# Patient Record
Sex: Male | Born: 1940
Health system: Southern US, Community
[De-identification: ages and names within clinical notes are randomized; demographics above are authoritative.]

## PROBLEM LIST (undated history)

## (undated) DIAGNOSIS — N400 Enlarged prostate without lower urinary tract symptoms: Secondary | ICD-10-CM

## (undated) DIAGNOSIS — I10 Essential (primary) hypertension: Secondary | ICD-10-CM

## (undated) HISTORY — PX: JOINT REPLACEMENT: SHX530

## (undated) HISTORY — DX: Essential (primary) hypertension: I10

## (undated) HISTORY — PX: TONSILLECTOMY: SUR1361

## (undated) HISTORY — PX: HERNIA REPAIR: SHX51

## (undated) HISTORY — DX: Benign prostatic hyperplasia without lower urinary tract symptoms: N40.0

## (undated) HISTORY — PX: PROSTATE SURGERY: SHX751

---

## 2011-01-04 LAB — HM COLONOSCOPY

## 2012-10-26 ENCOUNTER — Encounter (HOSPITAL_BASED_OUTPATIENT_CLINIC_OR_DEPARTMENT_OTHER): Payer: Self-pay | Admitting: *Deleted

## 2012-10-26 ENCOUNTER — Emergency Department (HOSPITAL_BASED_OUTPATIENT_CLINIC_OR_DEPARTMENT_OTHER)
Admission: EM | Admit: 2012-10-26 | Discharge: 2012-10-26 | Disposition: A | Discharge status: Home / Self Care | Payer: Medicare Other | Attending: Emergency Medicine | Admitting: Emergency Medicine

## 2012-10-26 ENCOUNTER — Emergency Department (HOSPITAL_BASED_OUTPATIENT_CLINIC_OR_DEPARTMENT_OTHER): Payer: Medicare Other

## 2012-10-26 DIAGNOSIS — S61219A Laceration without foreign body of unspecified finger without damage to nail, initial encounter: Secondary | ICD-10-CM

## 2012-10-26 DIAGNOSIS — S61209A Unspecified open wound of unspecified finger without damage to nail, initial encounter: Secondary | ICD-10-CM | POA: Insufficient documentation

## 2012-10-26 DIAGNOSIS — Z23 Encounter for immunization: Secondary | ICD-10-CM | POA: Insufficient documentation

## 2012-10-26 DIAGNOSIS — Y9389 Activity, other specified: Secondary | ICD-10-CM | POA: Insufficient documentation

## 2012-10-26 DIAGNOSIS — W278XXA Contact with other nonpowered hand tool, initial encounter: Secondary | ICD-10-CM | POA: Insufficient documentation

## 2012-10-26 DIAGNOSIS — Y929 Unspecified place or not applicable: Secondary | ICD-10-CM | POA: Insufficient documentation

## 2012-10-26 MED ORDER — TETANUS-DIPHTH-ACELL PERTUSSIS 5-2.5-18.5 LF-MCG/0.5 IM SUSP
0.5000 mL | Freq: Once | INTRAMUSCULAR | Status: AC
  Administered 2012-10-26: 0.5 mL via INTRAMUSCULAR
  Filled 2012-10-26: qty 0.5

## 2012-10-26 NOTE — ED Notes (Signed)
MD at bedside. 

## 2012-10-26 NOTE — ED Notes (Signed)
Pt c/o left index finger laceration by hand saw x 30 mins ago .

## 2012-10-26 NOTE — ED Notes (Signed)
Suture cart at bedside 

## 2012-10-26 NOTE — ED Provider Notes (Addendum)
History     CSN: [id number]  Arrival date & time 10/26/12  1529   First MD Initiated Contact with Patient 10/26/12 1647      Chief Complaint  Patient presents with  . Laceration    (Consider location/radiation/quality/duration/timing/severity/associated sxs/prior treatment) Patient is a 72 y.o. male presenting with skin laceration. The history is provided by the patient.  Laceration Location:  Finger Finger laceration location:  R index finger Length (cm):  2 Depth:  Cutaneous Quality: jagged   Bleeding: controlled   Time since incident:  30 minutes Injury mechanism: table saw. Pain details:    Quality:  Aching   Severity:  Mild   Timing:  Constant   Progression:  Unchanged Foreign body present:  No foreign bodies Tetanus status:  Out of date   History reviewed. No pertinent past medical history.  Past Surgical History  Procedure Laterality Date  . Hernia repair    . Joint replacement    . Tonsillectomy      History reviewed. No pertinent family history.  History  Substance Use Topics  . Smoking status: Never Smoker   . Smokeless tobacco: Not on file  . Alcohol Use: No      Review of Systems  Neurological: Negative for weakness and numbness.  All other systems reviewed and are negative.    Allergies  Review of patient's allergies indicates no known allergies.  Home Medications  No current outpatient prescriptions on file.  BP 161/81  Pulse 70  Temp(Src) 98.6 F (37 C) (Oral)  Resp 16  Ht 5\' 10"  (1.778 m)  Wt 175 lb (79.379 kg)  BMI 25.11 kg/m2  SpO2 100%  Physical Exam  Nursing note and vitals reviewed. Constitutional: He is oriented to person, place, and time. He appears well-developed and well-nourished. No distress.  HENT:  Head: Normocephalic and atraumatic.  Eyes: EOM are normal. Pupils are equal, round, and reactive to light.  Cardiovascular: Normal rate.   Pulmonary/Chest: Effort normal.  Musculoskeletal:       Right hand: He  exhibits tenderness and laceration. He exhibits normal range of motion, no bony tenderness, no deformity and no swelling. Normal sensation noted. Normal strength noted.       Hands: Laceration to the tip of the index finger just distal to the nail.  No nail invovlement.  Arthritis changeds to DIP joint causing decreased ROM.  Normal sensation.  Neurological: He is alert and oriented to person, place, and time.  Skin: Skin is warm and dry. No rash noted. No erythema.    ED Course  Procedures (including critical care time)  Labs Reviewed - No data to display Dg Finger Index Left  10/26/2012   *RADIOLOGY REPORT*  Clinical Data: Laceration from table saw  LEFT INDEX FINGER 2+V  Comparison: None.  Findings: Three views of the left second finger submitted.  No acute fracture or subluxation.  Extensive degenerative osteoarthritic changes distal interphalangeal joint.  Mild narrowing of joint space proximal interphalangeal joint.  IMPRESSION: No acute fracture or subluxation.  Degenerative changes distal interphalangeal joint.   Original Report Authenticated By: Natasha Mead, M.D.   LACERATION REPAIR Performed by: Gwyneth Sprout Authorized by: Gwyneth Sprout Consent: Verbal consent obtained. Risks and benefits: risks, benefits and alternatives were discussed Consent given by: patient Patient identity confirmed: provided demographic data Prepped and Draped in normal sterile fashion Wound explored  Laceration Location: right index finger  Laceration Length: 2cm  No Foreign Bodies seen or palpated  Anesthesia: local infiltration  Local anesthetic:  lidocaine 1% without epinephrine  Anesthetic total: 1 ml  Irrigation method: syringe Amount of cleaning: standard  Skin closure: 4.0 prolene  Number of sutures: 6  Technique: simple interrupted.  Patient tolerance: Patient tolerated the procedure well with no immediate complications.   1. Finger laceration, initial encounter        MDM   Right index finger laceration from a table saw without underlying fracture and no significant nailbed laceration. Laceration that the tip of the finger and was repaired as above. Tetanus shot updated today. Normal sensation with decreased range of motion however that is chronic from arthritic changes.  No tendon damage.        Gwyneth Sprout, MD 10/26/12 1729  Gwyneth Sprout, MD 10/26/12 1731

## 2012-12-07 ENCOUNTER — Emergency Department (HOSPITAL_BASED_OUTPATIENT_CLINIC_OR_DEPARTMENT_OTHER)
Admission: EM | Admit: 2012-12-07 | Discharge: 2012-12-07 | Disposition: A | Discharge status: Home / Self Care | Payer: Medicare Other | Attending: Emergency Medicine | Admitting: Emergency Medicine

## 2012-12-07 ENCOUNTER — Encounter (HOSPITAL_BASED_OUTPATIENT_CLINIC_OR_DEPARTMENT_OTHER): Payer: Self-pay

## 2012-12-07 DIAGNOSIS — L03211 Cellulitis of face: Secondary | ICD-10-CM | POA: Insufficient documentation

## 2012-12-07 DIAGNOSIS — L0291 Cutaneous abscess, unspecified: Secondary | ICD-10-CM

## 2012-12-07 DIAGNOSIS — Z79899 Other long term (current) drug therapy: Secondary | ICD-10-CM | POA: Insufficient documentation

## 2012-12-07 DIAGNOSIS — L0201 Cutaneous abscess of face: Secondary | ICD-10-CM | POA: Insufficient documentation

## 2012-12-07 DIAGNOSIS — R509 Fever, unspecified: Secondary | ICD-10-CM | POA: Insufficient documentation

## 2012-12-07 MED ORDER — MUPIROCIN CALCIUM 2 % EX CREA: TOPICAL_CREAM | Freq: Three times a day (TID) | CUTANEOUS | Status: DC

## 2012-12-07 MED ORDER — SULFAMETHOXAZOLE-TRIMETHOPRIM 800-160 MG PO TABS: 1.0000 | ORAL_TABLET | Freq: Two times a day (BID) | ORAL | Status: DC

## 2012-12-07 NOTE — ED Notes (Signed)
Facial pain an swelling x 2 days.

## 2012-12-07 NOTE — ED Provider Notes (Signed)
History    CSN: [id number] Arrival date & time 12/07/12  1032  First MD Initiated Contact with Patient 12/07/12 1050     Chief Complaint  Patient presents with  . Facial Swelling   (Consider location/radiation/quality/duration/timing/severity/associated sxs/prior Treatment) HPI Comments: Patient presents with facial swelling. He states that it started 2 days ago with a small pimple in his left nare. This area has become more tender. He now has surrounding swelling to the left side of the space. He has no difficulty breathing or tongue or lip swelling. He has had some subjective fevers but no chills. He's had no nausea or vomiting. He has constant throbbing pain to his left nare.  History reviewed. No pertinent past medical history. Past Surgical History  Procedure Laterality Date  . Hernia repair    . Joint replacement    . Tonsillectomy     No family history on file. History  Substance Use Topics  . Smoking status: Never Smoker   . Smokeless tobacco: Not on file  . Alcohol Use: No    Review of Systems  Constitutional: Positive for fever. Negative for chills, diaphoresis and fatigue.  HENT: Negative for congestion, rhinorrhea and sneezing.   Eyes: Negative.   Respiratory: Negative for cough, chest tightness and shortness of breath.   Cardiovascular: Negative for chest pain and leg swelling.  Gastrointestinal: Negative for nausea, vomiting, abdominal pain, diarrhea and blood in stool.  Genitourinary: Negative for frequency, hematuria, flank pain and difficulty urinating.  Musculoskeletal: Negative for back pain and arthralgias.  Skin: Positive for wound. Negative for rash.  Neurological: Negative for dizziness, speech difficulty, weakness, numbness and headaches.    Allergies  Review of patient's allergies indicates no known allergies.  Home Medications   Current Outpatient Rx  Name  Route  Sig  Dispense  Refill  . Cholecalciferol (VITAMIN D PO)   Oral   Take by  mouth.         . naproxen sodium (ANAPROX) 220 MG tablet   Oral   Take 220 mg by mouth 2 (two) times daily with a meal.         . mupirocin cream (BACTROBAN) 2 %   Topical   Apply topically 3 (three) times daily.   15 g   0   . sulfamethoxazole-trimethoprim (BACTRIM DS,SEPTRA DS) 800-160 MG per tablet   Oral   Take 1 tablet by mouth 2 (two) times daily. One po bid x 10 days   20 tablet   0    BP 142/83  Pulse 71  Temp(Src) 98.7 F (37.1 C) (Oral)  Resp 16  Ht 5\' 10"  (1.778 m)  Wt 180 lb (81.647 kg)  BMI 25.83 kg/m2  SpO2 99% Physical Exam  Constitutional: He is oriented to person, place, and time. He appears well-developed and well-nourished.  HENT:  Head: Normocephalic and atraumatic.  Patient has a small 1 cm indurated area that is erythematous to the left knee are just at the opening of the left knee are. There is a small amount of surrounding facial erythema and swelling to the left side of the cheek. There is no drainage from the site. There is a small scab at the anterior portion of the wound. There is no drainage. There is no fluctuance.  Eyes: Pupils are equal, round, and reactive to light.  Neck: Normal range of motion. Neck supple.  Cardiovascular: Normal rate, regular rhythm and normal heart sounds.   Pulmonary/Chest: Effort normal and breath sounds normal.  No respiratory distress. He has no wheezes. He has no rales. He exhibits no tenderness.  Abdominal: Soft. Bowel sounds are normal. There is no tenderness. There is no rebound and no guarding.  Musculoskeletal: Normal range of motion. He exhibits no edema.  Lymphadenopathy:    He has no cervical adenopathy.  Neurological: He is alert and oriented to person, place, and time.  Skin: Skin is warm and dry. No rash noted.  Psychiatric: He has a normal mood and affect.    ED Course  Procedures (including critical care time) Labs Reviewed - No data to display No results found. 1. Abscess and cellulitis      MDM  Patient with a small abscess to his left knee nurse with surrounding mild cellulitis. He was started on antibiotics including Bactrim and Bactroban cream. At this point I don't feel anything that is amenable to I&D. I advised the patient to start the antibiotics and to return in 2 days for wound check or sooner if his symptoms worsen or the facial swelling increases. His tetanus shot is up-to-date.  Rolan Bucco, MD 12/07/12 1133

## 2012-12-11 ENCOUNTER — Emergency Department (HOSPITAL_BASED_OUTPATIENT_CLINIC_OR_DEPARTMENT_OTHER)
Admission: EM | Admit: 2012-12-11 | Discharge: 2012-12-11 | Disposition: A | Discharge status: Home / Self Care | Payer: Medicare Other | Attending: Emergency Medicine | Admitting: Emergency Medicine

## 2012-12-11 ENCOUNTER — Encounter (HOSPITAL_BASED_OUTPATIENT_CLINIC_OR_DEPARTMENT_OTHER): Payer: Self-pay

## 2012-12-11 DIAGNOSIS — Z791 Long term (current) use of non-steroidal anti-inflammatories (NSAID): Secondary | ICD-10-CM | POA: Insufficient documentation

## 2012-12-11 DIAGNOSIS — L0291 Cutaneous abscess, unspecified: Secondary | ICD-10-CM

## 2012-12-11 DIAGNOSIS — R509 Fever, unspecified: Secondary | ICD-10-CM | POA: Insufficient documentation

## 2012-12-11 DIAGNOSIS — K130 Diseases of lips: Secondary | ICD-10-CM | POA: Insufficient documentation

## 2012-12-11 MED ORDER — BUPIVACAINE-EPINEPHRINE (PF) 0.5% -1:200000 IJ SOLN
INTRAMUSCULAR | Status: AC
  Filled 2012-12-11: qty 1.8

## 2012-12-11 MED ORDER — LIDOCAINE HCL (PF) 1 % IJ SOLN
2.0000 mL | Freq: Once | INTRAMUSCULAR | Status: DC
  Filled 2012-12-11: qty 5

## 2012-12-11 MED ORDER — BUPIVACAINE HCL 0.25 % IJ SOLN
5.0000 mL | Freq: Once | INTRAMUSCULAR | Status: DC
  Filled 2012-12-11: qty 1

## 2012-12-11 MED ORDER — BUPIVACAINE-EPINEPHRINE PF 0.5-1:200000 % IJ SOLN
1.8000 mL | Freq: Once | INTRAMUSCULAR | Status: AC
  Filled 2012-12-11: qty 1.8

## 2012-12-11 MED ORDER — OXYCODONE-ACETAMINOPHEN 5-325 MG PO TABS: 1.0000 | ORAL_TABLET | ORAL | Status: DC | PRN

## 2012-12-11 NOTE — ED Notes (Signed)
Pt is here for a recheck of abscess on left side of face.

## 2012-12-11 NOTE — ED Provider Notes (Addendum)
CSN: [id number]     Arrival date & time 12/11/12  1746 History  This chart was scribed for Rolan Bucco, MD by Bennett Scrape, ED Scribe. This patient was seen in room MH05/MH05 and the patient's care was started at 6:00 PM.   First MD Initiated Contact with Patient 12/11/12 1758     Chief Complaint  Patient presents with  . Wound Check    Patient is a 72 y.o. male presenting with wound check. The history is provided by the patient. No language interpreter was used.  Wound Check This is a new problem. The current episode started more than 2 days ago. The problem occurs constantly. The problem has been gradually worsening. Pertinent negatives include no chest pain, no abdominal pain, no headaches and no shortness of breath. Treatments tried: antibiotics. The treatment provided mild relief.    HPI Comments: Roger Williams is a 72 y.o. male who presents to the Emergency Department complaining of increased pain in the upper lip since yesterday. He was seen on 12/07/12 for 2 days of worsening facial swelling and pain that started out as a pimple in the left nare. He was diagnosed with an abscess with surrounding mild cellulitis and was started on Bactrim and Bactroban cream. He states that the swelling has since improved but the pain became more intense yesterday. He reports an associated "low grade" fever and a mild amount of drainage from the area since yesterday. He denies chills, nausea and vomiting. Pt does not have a h/o chronic medical conditions.  He reports that he will have a PCP on October 1st, 2014 at [facility]  History reviewed. No pertinent past medical history.  Past Surgical History  Procedure Laterality Date  . Hernia repair    . Joint replacement    . Tonsillectomy     No family history on file. History  Substance Use Topics  . Smoking status: Never Smoker   . Smokeless tobacco: Not on file  . Alcohol Use: No    Review of Systems  Constitutional: Positive for  fever. Negative for chills, diaphoresis and fatigue.  HENT: Positive for facial swelling. Negative for congestion, rhinorrhea and sneezing.   Eyes: Negative.   Respiratory: Negative for cough, chest tightness and shortness of breath.   Cardiovascular: Negative for chest pain and leg swelling.  Gastrointestinal: Negative for nausea, vomiting, abdominal pain, diarrhea and blood in stool.  Genitourinary: Negative for frequency, hematuria, flank pain and difficulty urinating.  Musculoskeletal: Negative for back pain and arthralgias.  Skin: Positive for wound. Negative for rash.  Neurological: Negative for dizziness, speech difficulty, weakness, numbness and headaches.    Allergies  Review of patient's allergies indicates no known allergies.  Home Medications   Current Outpatient Rx  Name  Route  Sig  Dispense  Refill  . Cholecalciferol (VITAMIN D PO)   Oral   Take by mouth.         . mupirocin cream (BACTROBAN) 2 %   Topical   Apply topically 3 (three) times daily.   15 g   0   . naproxen sodium (ANAPROX) 220 MG tablet   Oral   Take 220 mg by mouth 2 (two) times daily with a meal.         . oxyCODONE-acetaminophen (PERCOCET) 5-325 MG per tablet   Oral   Take 1 tablet by mouth every 4 (four) hours as needed for pain.   20 tablet   0   . sulfamethoxazole-trimethoprim (BACTRIM DS,SEPTRA DS) 800-160 MG  per tablet   Oral   Take 1 tablet by mouth 2 (two) times daily. One po bid x 10 days   20 tablet   0    Triage Vitals: BP 150/84  Pulse 70  Temp(Src) 98.9 F (37.2 C) (Oral)  Resp 16  Wt 180 lb (81.647 kg)  BMI 25.83 kg/m2  SpO2 97%  Physical Exam  Nursing note and vitals reviewed. Constitutional: He is oriented to person, place, and time. He appears well-developed and well-nourished. No distress.  HENT:  Head: Normocephalic and atraumatic.  Indurated area to the left upper lip that is pointing along the inner aspect of the mucosa with minor purulent drainage, no  other facial swelling or erythema   Eyes: Pupils are equal, round, and reactive to light.  Neck: Neck supple. No tracheal deviation present.  Cardiovascular: Normal rate.   Pulmonary/Chest: Effort normal. No respiratory distress.  Musculoskeletal: Normal range of motion.  Neurological: He is alert and oriented to person, place, and time.  Skin: Skin is warm and dry.  Psychiatric: He has a normal mood and affect. His behavior is normal.    ED Course   INCISION AND DRAINAGE Date/Time: 12/11/2012 6:46 PM Performed by: Rylin Saez Authorized by: Rolan Bucco Consent: Verbal consent obtained. Risks and benefits: risks, benefits and alternatives were discussed Consent given by: patient Patient identity confirmed: verbally with patient Type: abscess Body area: head/neck (left upper lip) Anesthesia: nerve block Local anesthetic: bupivacaine 0.5% without epinephrine Anesthetic total: 1 ml Patient sedated: no Scalpel size: 11 Incision type: elliptical Drainage: purulent Drainage amount: moderate Wound treatment: wound left open Patient tolerance: Patient tolerated the procedure well with no immediate complications.   (including critical care time)  DIAGNOSTIC STUDIES: Oxygen Saturation is 97% on room air, normal by my interpretation.    COORDINATION OF CARE: 6:06 PM-Discussed treatment plan with pt at bedside and pt agreed to plan.   Labs Reviewed - No data to display No results found. 1. Abscess     MDM  Patient presented previously with a injury the early abscess to the left malar with associated cellulitis. The cellulitis has markedly improved however now he has a definite abscess which is formed in his upper lip. It was point seen in the mucosal surface. Given this I did a dental block and opened it through the mucosal surface. There is a large amount of purulent drainage. He will continue the antibiotics he is currently taking. I also given prescription for Percocet.  Advised him to return in 2 days for wound check or sooner for symptoms worsen.  I personally performed the services described in this documentation, which was scribed in my presence.  The recorded information has been reviewed and considered.     Rolan Bucco, MD 12/11/12 4098  Rolan Bucco, MD 12/11/12 1191

## 2012-12-20 ENCOUNTER — Encounter: Payer: Self-pay | Admitting: *Deleted

## 2012-12-20 ENCOUNTER — Ambulatory Visit (HOSPITAL_BASED_OUTPATIENT_CLINIC_OR_DEPARTMENT_OTHER)
Admission: RE | Admit: 2012-12-20 | Discharge: 2012-12-20 | Disposition: A | Discharge status: Home / Self Care | Payer: Medicare Other | Source: Ambulatory Visit | Attending: Physician Assistant | Admitting: Physician Assistant

## 2012-12-20 ENCOUNTER — Ambulatory Visit (INDEPENDENT_AMBULATORY_CARE_PROVIDER_SITE_OTHER): Payer: Medicare Other | Admitting: Physician Assistant

## 2012-12-20 ENCOUNTER — Encounter: Payer: Self-pay | Admitting: Physician Assistant

## 2012-12-20 VITALS — BP 128/74 | HR 63 | Temp 97.8°F | Resp 16 | Ht 69.5 in | Wt 176.8 lb

## 2012-12-20 DIAGNOSIS — Z0000 Encounter for general adult medical examination without abnormal findings: Secondary | ICD-10-CM

## 2012-12-20 DIAGNOSIS — Z01818 Encounter for other preprocedural examination: Secondary | ICD-10-CM | POA: Insufficient documentation

## 2012-12-20 LAB — URINALYSIS, ROUTINE W REFLEX MICROSCOPIC
Glucose, UA: NEGATIVE mg/dL
Leukocytes, UA: NEGATIVE
Nitrite: NEGATIVE
Specific Gravity, Urine: 1.019 (ref 1.005–1.030)
pH: 6 (ref 5.0–8.0)

## 2012-12-20 LAB — HEPATIC FUNCTION PANEL
Albumin: 4 g/dL (ref 3.5–5.2)
Bilirubin, Direct: 0.1 mg/dL (ref 0.0–0.3)
Total Bilirubin: 0.5 mg/dL (ref 0.3–1.2)

## 2012-12-20 LAB — CBC WITH DIFFERENTIAL/PLATELET
Eosinophils Relative: 2 % (ref 0–5)
Lymphocytes Relative: 26 % (ref 12–46)
Lymphs Abs: 1.1 10*3/uL (ref 0.7–4.0)
MCV: 91.8 fL (ref 78.0–100.0)
Neutro Abs: 2.7 10*3/uL (ref 1.7–7.7)
Platelets: 232 10*3/uL (ref 150–400)
RBC: 4.85 MIL/uL (ref 4.22–5.81)
WBC: 4.2 10*3/uL (ref 4.0–10.5)

## 2012-12-20 LAB — BASIC METABOLIC PANEL WITH GFR
BUN: 15 mg/dL (ref 6–23)
CO2: 33 meq/L — ABNORMAL HIGH (ref 19–32)
Calcium: 9.4 mg/dL (ref 8.4–10.5)
Chloride: 102 meq/L (ref 96–112)
Creat: 1 mg/dL (ref 0.50–1.35)
Glucose, Bld: 90 mg/dL (ref 70–99)
Potassium: 5.6 meq/L — ABNORMAL HIGH (ref 3.5–5.3)
Sodium: 139 meq/L (ref 135–145)

## 2012-12-20 LAB — LIPID PANEL
HDL: 36 mg/dL — ABNORMAL LOW (ref >39)
Triglycerides: 103 mg/dL (ref <150)

## 2012-12-20 LAB — TSH: TSH: 1.717 u[IU]/mL (ref 0.350–4.500)

## 2012-12-20 LAB — PROTIME-INR: INR: 0.87 (ref <1.50)

## 2012-12-20 LAB — PSA: PSA: 2.93 ng/mL

## 2012-12-20 NOTE — Patient Instructions (Addendum)
I will call you with the results of your labs whether normal or abnormal.  I will also call you to notify you when I have sent the letter to your physician, granting surgical clearance.  Please return to clinic in 1 year for annual exam or sooner if needed.

## 2012-12-20 NOTE — Assessment & Plan Note (Signed)
Obtain labs to include BMP, Hepatic Function tests, CBC, TSH, Lipid Panel, PSA.   Will call patient with results.

## 2012-12-20 NOTE — Assessment & Plan Note (Signed)
Obtain labs to include BMP, Hepatic Function tests, PT/INR, CBC, UA, Urine Culture.  CXR to be obtained.  EKG shows not acute abnormalities or irregular rhythm.  Clearance will be given pending normal lab results.  I will fax clearance to his Orthopedist.

## 2012-12-20 NOTE — Progress Notes (Signed)
[name], male   DOB: [birth year], 72 y.o.   MRN: [id number]   Roger Williams is a 72 year-old gentleman who presents to clinic today to establish care as to obtain clearance for hip replacement surgery scheduled for mid September of this year.  Patient denies any current concerns.    History reviewed. No pertinent past medical history.  Current Outpatient Prescriptions on File Prior to Visit  Medication Sig Dispense Refill  . Cholecalciferol (VITAMIN D PO) Take by mouth.       No current facility-administered medications on file prior to visit.   No Known Allergies  Family History  Problem Relation Age of Onset  . Parkinson's disease Father   . Parkinson's disease Brother    History   Social History  . Marital Status: Married    Spouse Name: N/A    Number of Children: N/A  . Years of Education: N/A   Social History Main Topics  . Smoking status: Never Smoker   . Smokeless tobacco: Never Used  . Alcohol Use: No  . Drug Use: No  . Sexually Active: No   Other Topics Concern  . None   Social History Narrative  . None   Review of Systems  Constitutional: Negative for fever, chills, weight loss and malaise/fatigue.  HENT: Negative for hearing loss, ear pain and tinnitus.   Eyes: Negative for blurred vision, double vision and photophobia.  Respiratory: Negative for cough, hemoptysis, sputum production, shortness of breath and wheezing.   Cardiovascular: Negative for chest pain, palpitations and leg swelling.  Gastrointestinal: Negative for heartburn, nausea, vomiting, abdominal pain, diarrhea, constipation, blood in stool and melena.  Genitourinary: Negative for dysuria, urgency, frequency, hematuria and flank pain.  Musculoskeletal: Negative for myalgias and back pain.  Skin: Negative for rash.  Neurological: Negative for dizziness, tingling, loss of consciousness and headaches.  Endo/Heme/Allergies: Does not bruise/bleed easily.  Psychiatric/Behavioral:  Negative for depression, suicidal ideas and substance abuse.   Physical Exam  Nursing note and vitals reviewed. Constitutional: He is oriented to person, place, and time and well-developed, well-nourished, and in no distress. No distress.  HENT:  Head: Normocephalic and atraumatic.  Right Ear: External ear normal.  Left Ear: External ear normal.  Nose: Nose normal.  Mouth/Throat: Oropharynx is clear and moist. No oropharyngeal exudate.  Eyes: Conjunctivae and EOM are normal. Pupils are equal, round, and reactive to light.  Neck: Normal range of motion. Neck supple. No tracheal deviation present. No thyromegaly present.  Cardiovascular: Normal rate, regular rhythm, normal heart sounds and intact distal pulses.   Pulmonary/Chest: Effort normal and breath sounds normal. No respiratory distress. He has no wheezes. He has no rales. He exhibits no tenderness.  Abdominal: Soft. Bowel sounds are normal. He exhibits no distension and no mass. There is no tenderness. There is no rebound and no guarding.  Musculoskeletal: Normal range of motion.  Lymphadenopathy:    He has no cervical adenopathy.  Neurological: He is alert and oriented to person, place, and time. He has normal reflexes. No cranial nerve deficit.  Skin: Skin is warm and dry. No rash noted. He is not diaphoretic.   Labs/Diagnostics:    EKG -- sinus bradycardia at 56bpm; no abnormalities noted.  Assessment/Plan: (1) Surgical Clearance Request -- Obtain labs to include BMP, Hepatic Function tests, PT/INR, CBC, UA, Urine Culture.  CXR to be obtained.  EKG shows not acute abnormalities or irregular rhythm.  Clearance will be given pending normal lab results.  I will  fax clearance to his Orthopedist. (2) Preventive Health -- Obtain fasting labs (see above) + TSH, Lipid Panel, PSA.   Will call patient with results.

## 2012-12-20 NOTE — Assessment & Plan Note (Signed)
Obtain labs to include BMP, Hepatic Function tests, PT/INR, CBC, UA, Urine Culture.  CXR to be obtained.  EKG shows not acute abnormalities or irregular rhythm.  Clearance will be given pending normal lab results.  I will fax clearance to his Orthopedist.  

## 2012-12-21 ENCOUNTER — Other Ambulatory Visit: Payer: Self-pay | Admitting: Physician Assistant

## 2012-12-21 ENCOUNTER — Telehealth: Payer: Self-pay | Admitting: Physician Assistant

## 2012-12-21 DIAGNOSIS — R899 Unspecified abnormal finding in specimens from other organs, systems and tissues: Secondary | ICD-10-CM

## 2012-12-21 LAB — URINE CULTURE
Colony Count: NO GROWTH
Organism ID, Bacteria: NO GROWTH

## 2012-12-21 NOTE — Telephone Encounter (Signed)
Discussed lab results with patient.  Patient had an elevated potassium level 5.6 on 12/20/12.  Patient is asymptomatic.  CBC without presence of leukocytosis or thrombocytosis.  EKG was NSR.  No hx of kidney disease.  No evidence of kidney disease on lab results.  Hopefully this is a spurious elevation in potassium, possibly due to lab error.

## 2012-12-22 LAB — BASIC METABOLIC PANEL
CO2: 29 mEq/L (ref 19–32)
Calcium: 8.8 mg/dL (ref 8.4–10.5)
Chloride: 104 mEq/L (ref 96–112)
Creat: 1.01 mg/dL (ref 0.50–1.35)
Glucose, Bld: 95 mg/dL (ref 70–99)

## 2014-10-23 ENCOUNTER — Telehealth: Payer: Self-pay | Admitting: Physician Assistant

## 2014-10-23 NOTE — Telephone Encounter (Signed)
[place] in [place]  He will be back tomorrow  He needs some steroids called in so he can take them tonight when he gets home.  He [facility] to [facility] [address]  He can be reached on his cell [phone]

## 2014-10-23 NOTE — Telephone Encounter (Signed)
Patient has not been seen in almost 2 years.  Will need appointment.

## 2014-10-24 ENCOUNTER — Encounter: Payer: Self-pay | Admitting: Physician Assistant

## 2014-10-24 ENCOUNTER — Ambulatory Visit (INDEPENDENT_AMBULATORY_CARE_PROVIDER_SITE_OTHER): Payer: Medicare Other | Admitting: Physician Assistant

## 2014-10-24 VITALS — BP 145/74 | HR 72 | Temp 97.8°F | Resp 16 | Ht 69.5 in | Wt 185.2 lb

## 2014-10-24 DIAGNOSIS — L255 Unspecified contact dermatitis due to plants, except food: Secondary | ICD-10-CM

## 2014-10-24 MED ORDER — METHYLPREDNISOLONE ACETATE 80 MG/ML IJ SUSP
80.0000 mg | Freq: Once | INTRAMUSCULAR | Status: AC
  Administered 2014-10-24: 80 mg via INTRAMUSCULAR

## 2014-10-24 MED ORDER — CLOBETASOL PROPIONATE 0.05 % EX CREA: 1.0000 "application " | TOPICAL_CREAM | Freq: Two times a day (BID) | CUTANEOUS | Status: DC

## 2014-10-24 NOTE — Assessment & Plan Note (Signed)
With blistering at site of contact. 80 mg IM depo medrol given by nursing staff. Rx clobetasol cream.  Supportive measures reviewed. Follow-up if symptoms are not improving. Patient encouraged to schedule CPE as he is overdue.

## 2014-10-24 NOTE — Addendum Note (Signed)
Addended by: Regis BillSCATES, SHARON L on: 10/24/2014 02:21 PM   Modules accepted: Orders

## 2014-10-24 NOTE — Progress Notes (Signed)
Pre visit review using our clinic review tool, if applicable. No additional management support is needed unless otherwise documented below in the visit note/SLS  

## 2014-10-24 NOTE — Patient Instructions (Signed)
Please use Clobetasol cream twice daily as directed. The steroid shot given should start to clear things up. Cool compresses and Sarna lotion will also help with itch. Take a zantac or claritin to help with itch. Follow-up if symptoms are not resolving.

## 2014-10-24 NOTE — Progress Notes (Signed)
   Patient presents to clinic today c/o pruritic rash of R arms first noticed four days ago.  Patient was on vacation and was going through a very grassy area before symptom onset.  Noted poison ivy plants.  Denies fever, chills or dyspnea. Has tried OTC cortisone without relief in symptoms.  No past medical history on file.  No current outpatient prescriptions on file prior to visit.   No current facility-administered medications on file prior to visit.    No Known Allergies  Family History  Problem Relation Age of Onset  . Parkinson's disease Father   . Parkinson's disease Brother     History   Social History  . Marital Status: Married    Spouse Name: N/A  . Number of Children: N/A  . Years of Education: N/A   Social History Main Topics  . Smoking status: Never Smoker   . Smokeless tobacco: Never Used  . Alcohol Use: No  . Drug Use: No  . Sexual Activity: No   Other Topics Concern  . None   Social History Narrative   Review of Systems - See HPI.  All other ROS are negative.  BP 145/74 mmHg  Pulse 72  Temp(Src) 97.8 F (36.6 C) (Oral)  Resp 16  Ht 5' 9.5" (1.765 m)  Wt 185 lb 4 oz (84.029 kg)  BMI 26.97 kg/m2  SpO2 97%  Physical Exam  Constitutional: He is oriented to person, place, and time and well-developed, well-nourished, and in no distress.  HENT:  Head: Normocephalic and atraumatic.  Cardiovascular: Normal rate, regular rhythm, normal heart sounds and intact distal pulses.   Pulmonary/Chest: Effort normal and breath sounds normal. No respiratory distress. He has no wheezes. He has no rales. He exhibits no tenderness.  Neurological: He is alert and oriented to person, place, and time.  Skin: Skin is warm and dry. Rash noted. Rash is papular and vesicular.     Vitals reviewed.  No results found for this or any previous visit (from the past 2160 hour(s)).  Assessment/Plan: Rhus dermatitis With blistering at site of contact. 80 mg IM depo medrol  given by nursing staff. Rx clobetasol cream.  Supportive measures reviewed. Follow-up if symptoms are not improving. Patient encouraged to schedule CPE as he is overdue.

## 2014-11-12 ENCOUNTER — Ambulatory Visit (INDEPENDENT_AMBULATORY_CARE_PROVIDER_SITE_OTHER): Payer: Medicare Other | Admitting: Physician Assistant

## 2014-11-12 ENCOUNTER — Encounter: Payer: Self-pay | Admitting: Physician Assistant

## 2014-11-12 VITALS — BP 148/79 | HR 63 | Temp 98.6°F | Ht 70.0 in | Wt 184.8 lb

## 2014-11-12 DIAGNOSIS — Z23 Encounter for immunization: Secondary | ICD-10-CM | POA: Diagnosis not present

## 2014-11-12 DIAGNOSIS — Z0000 Encounter for general adult medical examination without abnormal findings: Secondary | ICD-10-CM | POA: Diagnosis not present

## 2014-11-12 DIAGNOSIS — Z136 Encounter for screening for cardiovascular disorders: Secondary | ICD-10-CM | POA: Insufficient documentation

## 2014-11-12 DIAGNOSIS — Z125 Encounter for screening for malignant neoplasm of prostate: Secondary | ICD-10-CM | POA: Insufficient documentation

## 2014-11-12 LAB — URINALYSIS, ROUTINE W REFLEX MICROSCOPIC
Bilirubin Urine: NEGATIVE
Hgb urine dipstick: NEGATIVE
Ketones, ur: NEGATIVE
LEUKOCYTES UA: NEGATIVE
NITRITE: NEGATIVE
RBC / HPF: NONE SEEN (ref 0–?)
SPECIFIC GRAVITY, URINE: 1.025 (ref 1.000–1.030)
Total Protein, Urine: NEGATIVE
UROBILINOGEN UA: 0.2 (ref 0.0–1.0)
Urine Glucose: NEGATIVE
pH: 6 (ref 5.0–8.0)

## 2014-11-12 LAB — LIPID PANEL
CHOL/HDL RATIO: 4
Cholesterol: 159 mg/dL (ref 0–200)
HDL: 40.2 mg/dL (ref >39.00)
LDL CALC: 101 mg/dL — AB (ref 0–99)
NonHDL: 118.8
TRIGLYCERIDES: 90 mg/dL (ref 0.0–149.0)
VLDL: 18 mg/dL (ref 0.0–40.0)

## 2014-11-12 LAB — CBC
HCT: 48.5 % (ref 39.0–52.0)
Hemoglobin: 16.3 g/dL (ref 13.0–17.0)
MCHC: 33.5 g/dL (ref 30.0–36.0)
MCV: 95.3 fl (ref 78.0–100.0)
Platelets: 148 10*3/uL — ABNORMAL LOW (ref 150.0–400.0)
RBC: 5.09 Mil/uL (ref 4.22–5.81)
RDW: 13.3 % (ref 11.5–15.5)
WBC: 5.2 10*3/uL (ref 4.0–10.5)

## 2014-11-12 LAB — HEPATIC FUNCTION PANEL
ALBUMIN: 4.1 g/dL (ref 3.5–5.2)
ALT: 21 U/L (ref 0–53)
AST: 23 U/L (ref 0–37)
Alkaline Phosphatase: 65 U/L (ref 39–117)
BILIRUBIN DIRECT: 0.1 mg/dL (ref 0.0–0.3)
TOTAL PROTEIN: 6.6 g/dL (ref 6.0–8.3)
Total Bilirubin: 0.6 mg/dL (ref 0.2–1.2)

## 2014-11-12 LAB — BASIC METABOLIC PANEL
BUN: 15 mg/dL (ref 6–23)
CALCIUM: 9.4 mg/dL (ref 8.4–10.5)
CO2: 28 meq/L (ref 19–32)
CREATININE: 0.99 mg/dL (ref 0.40–1.50)
Chloride: 105 mEq/L (ref 96–112)
GFR: 78.38 mL/min (ref >60.00)
GLUCOSE: 102 mg/dL — AB (ref 70–99)
Potassium: 4.5 mEq/L (ref 3.5–5.1)
SODIUM: 139 meq/L (ref 135–145)

## 2014-11-12 LAB — PSA: PSA: 1.95 ng/mL (ref 0.10–4.00)

## 2014-11-12 NOTE — Patient Instructions (Signed)
Please go to the lab for blood work. I will call you with your results. Continue Ranitidine as needed for acid reflux. Consider starting an 81 mg (Baby) Aspirin daily to reduce risk for stroke/heart attack.  Follow-up will be based on your lab results.  Preventive Care for Adults A healthy lifestyle and preventive care can promote health and wellness. Preventive health guidelines for men include the following key practices:  A routine yearly physical is a good way to check with your health care provider about your health and preventative screening. It is a chance to share any concerns and updates on your health and to receive a thorough exam.  Visit your dentist for a routine exam and preventative care every 6 months. Brush your teeth twice a day and floss once a day. Good oral hygiene prevents tooth decay and gum disease.  The frequency of eye exams is based on your age, health, family medical history, use of contact lenses, and other factors. Follow your health care provider's recommendations for frequency of eye exams.  Eat a healthy diet. Foods such as vegetables, fruits, whole grains, low-fat dairy products, and lean protein foods contain the nutrients you need without too many calories. Decrease your intake of foods high in solid fats, added sugars, and salt. Eat the right amount of calories for you.Get information about a proper diet from your health care provider, if necessary.  Regular physical exercise is one of the most important things you can do for your health. Most adults should get at least 150 minutes of moderate-intensity exercise (any activity that increases your heart rate and causes you to sweat) each week. In addition, most adults need muscle-strengthening exercises on 2 or more days a week.  Maintain a healthy weight. The body mass index (BMI) is a screening tool to identify possible weight problems. It provides an estimate of body fat based on height and weight. Your  health care provider can find your BMI and can help you achieve or maintain a healthy weight.For adults 20 years and older:  A BMI below 18.5 is considered underweight.  A BMI of 18.5 to 24.9 is normal.  A BMI of 25 to 29.9 is considered overweight.  A BMI of 30 and above is considered obese.  Maintain normal blood lipids and cholesterol levels by exercising and minimizing your intake of saturated fat. Eat a balanced diet with plenty of fruit and vegetables. Blood tests for lipids and cholesterol should begin at age 87 and be repeated every 5 years. If your lipid or cholesterol levels are high, you are over 50, or you are at high risk for heart disease, you may need your cholesterol levels checked more frequently.Ongoing high lipid and cholesterol levels should be treated with medicines if diet and exercise are not working.  If you smoke, find out from your health care provider how to quit. If you do not use tobacco, do not start.  Lung cancer screening is recommended for adults aged 23-80 years who are at high risk for developing lung cancer because of a history of smoking. A yearly low-dose CT scan of the lungs is recommended for people who have at least a 30-pack-year history of smoking and are a current smoker or have quit within the past 15 years. A pack year of smoking is smoking an average of 1 pack of cigarettes a day for 1 year (for example: 1 pack a day for 30 years or 2 packs a day for 15 years). Yearly screening  should continue until the smoker has stopped smoking for at least 15 years. Yearly screening should be stopped for people who develop a health problem that would prevent them from having lung cancer treatment.  If you choose to drink alcohol, do not have more than 2 drinks per day. One drink is considered to be 12 ounces (355 mL) of beer, 5 ounces (148 mL) of wine, or 1.5 ounces (44 mL) of liquor.  Avoid use of street drugs. Do not share needles with anyone. Ask for help if  you need support or instructions about stopping the use of drugs.  High blood pressure causes heart disease and increases the risk of stroke. Your blood pressure should be checked at least every 1-2 years. Ongoing high blood pressure should be treated with medicines, if weight loss and exercise are not effective.  If you are 15-93 years old, ask your health care provider if you should take aspirin to prevent heart disease.  Diabetes screening involves taking a blood sample to check your fasting blood sugar level. This should be done once every 3 years, after age 4, if you are within normal weight and without risk factors for diabetes. Testing should be considered at a younger age or be carried out more frequently if you are overweight and have at least 1 risk factor for diabetes.  Colorectal cancer can be detected and often prevented. Most routine colorectal cancer screening begins at the age of 33 and continues through age 28. However, your health care provider may recommend screening at an earlier age if you have risk factors for colon cancer. On a yearly basis, your health care provider may provide home test kits to check for hidden blood in the stool. Use of a small camera at the end of a tube to directly examine the colon (sigmoidoscopy or colonoscopy) can detect the earliest forms of colorectal cancer. Talk to your health care provider about this at age 70, when routine screening begins. Direct exam of the colon should be repeated every 5-10 years through age 53, unless early forms of precancerous polyps or small growths are found.  People who are at an increased risk for hepatitis B should be screened for this virus. You are considered at high risk for hepatitis B if:  You were born in a country where hepatitis B occurs often. Talk with your health care provider about which countries are considered high risk.  Your parents were born in a high-risk country and you have not received a shot to  protect against hepatitis B (hepatitis B vaccine).  You have HIV or AIDS.  You use needles to inject street drugs.  You live with, or have sex with, someone who has hepatitis B.  You are a man who has sex with other men (MSM).  You get hemodialysis treatment.  You take certain medicines for conditions such as cancer, organ transplantation, and autoimmune conditions.  Hepatitis C blood testing is recommended for all people born from [birth year] through 1965 and any individual with known risks for hepatitis C.  Practice safe sex. Use condoms and avoid high-risk sexual practices to reduce the spread of sexually transmitted infections (STIs). STIs include gonorrhea, chlamydia, syphilis, trichomonas, herpes, HPV, and human immunodeficiency virus (HIV). Herpes, HIV, and HPV are viral illnesses that have no cure. They can result in disability, cancer, and death.  If you are at risk of being infected with HIV, it is recommended that you take a prescription medicine daily to prevent HIV infection.  This is called preexposure prophylaxis (PrEP). You are considered at risk if:  You are a man who has sex with other men (MSM) and have other risk factors.  You are a heterosexual man, are sexually active, and are at increased risk for HIV infection.  You take drugs by injection.  You are sexually active with a partner who has HIV.  Talk with your health care provider about whether you are at high risk of being infected with HIV. If you choose to begin PrEP, you should first be tested for HIV. You should then be tested every 3 months for as long as you are taking PrEP.  A one-time screening for abdominal aortic aneurysm (AAA) and surgical repair of large AAAs by ultrasound are recommended for men ages 59 to 35 years who are current or former smokers.  Healthy men should no longer receive prostate-specific antigen (PSA) blood tests as part of routine cancer screening. Talk with your health care provider about  prostate cancer screening.  Testicular cancer screening is not recommended for adult males who have no symptoms. Screening includes self-exam, a health care provider exam, and other screening tests. Consult with your health care provider about any symptoms you have or any concerns you have about testicular cancer.  Use sunscreen. Apply sunscreen liberally and repeatedly throughout the day. You should seek shade when your shadow is shorter than you. Protect yourself by wearing long sleeves, pants, a wide-brimmed hat, and sunglasses year round, whenever you are outdoors.  Once a month, do a whole-body skin exam, using a mirror to look at the skin on your back. Tell your health care provider about new moles, moles that have irregular borders, moles that are larger than a pencil eraser, or moles that have changed in shape or color.  Stay current with required vaccines (immunizations).  Influenza vaccine. All adults should be immunized every year.  Tetanus, diphtheria, and acellular pertussis (Td, Tdap) vaccine. An adult who has not previously received Tdap or who does not know his vaccine status should receive 1 dose of Tdap. This initial dose should be followed by tetanus and diphtheria toxoids (Td) booster doses every 10 years. Adults with an unknown or incomplete history of completing a 3-dose immunization series with Td-containing vaccines should begin or complete a primary immunization series including a Tdap dose. Adults should receive a Td booster every 10 years.  Varicella vaccine. An adult without evidence of immunity to varicella should receive 2 doses or a second dose if he has previously received 1 dose.  Human papillomavirus (HPV) vaccine. Males aged 6-21 years who have not received the vaccine previously should receive the 3-dose series. Males aged 22-26 years may be immunized. Immunization is recommended through the age of 91 years for any male who has sex with males and did not get any  or all doses earlier. Immunization is recommended for any person with an immunocompromised condition through the age of 74 years if he did not get any or all doses earlier. During the 3-dose series, the second dose should be obtained 4-8 weeks after the first dose. The third dose should be obtained 24 weeks after the first dose and 16 weeks after the second dose.  Zoster vaccine. One dose is recommended for adults aged 87 years or older unless certain conditions are present.  Measles, mumps, and rubella (MMR) vaccine. Adults born before [birth year] generally are considered immune to measles and mumps. Adults born in [birth year] or later should have 1 or more  doses of MMR vaccine unless there is a contraindication to the vaccine or there is laboratory evidence of immunity to each of the three diseases. A routine second dose of MMR vaccine should be obtained at least 28 days after the first dose for students attending postsecondary schools, health care workers, or international travelers. People who received inactivated measles vaccine or an unknown type of measles vaccine during 1963-1967 should receive 2 doses of MMR vaccine. People who received inactivated mumps vaccine or an unknown type of mumps vaccine before 1979 and are at high risk for mumps infection should consider immunization with 2 doses of MMR vaccine. Unvaccinated health care workers born before [birth year] who lack laboratory evidence of measles, mumps, or rubella immunity or laboratory confirmation of disease should consider measles and mumps immunization with 2 doses of MMR vaccine or rubella immunization with 1 dose of MMR vaccine.  Pneumococcal 13-valent conjugate (PCV13) vaccine. When indicated, a person who is uncertain of his immunization history and has no record of immunization should receive the PCV13 vaccine. An adult aged 66 years or older who has certain medical conditions and has not been previously immunized should receive 1 dose of PCV13 vaccine.  This PCV13 should be followed with a dose of pneumococcal polysaccharide (PPSV23) vaccine. The PPSV23 vaccine dose should be obtained at least 8 weeks after the dose of PCV13 vaccine. An adult aged 39 years or older who has certain medical conditions and previously received 1 or more doses of PPSV23 vaccine should receive 1 dose of PCV13. The PCV13 vaccine dose should be obtained 1 or more years after the last PPSV23 vaccine dose.  Pneumococcal polysaccharide (PPSV23) vaccine. When PCV13 is also indicated, PCV13 should be obtained first. All adults aged 47 years and older should be immunized. An adult younger than age 7 years who has certain medical conditions should be immunized. Any person who resides in a nursing home or long-term care facility should be immunized. An adult smoker should be immunized. People with an immunocompromised condition and certain other conditions should receive both PCV13 and PPSV23 vaccines. People with human immunodeficiency virus (HIV) infection should be immunized as soon as possible after diagnosis. Immunization during chemotherapy or radiation therapy should be avoided. Routine use of PPSV23 vaccine is not recommended for American Indians, [place] Natives, or people younger than 65 years unless there are medical conditions that require PPSV23 vaccine. When indicated, people who have unknown immunization and have no record of immunization should receive PPSV23 vaccine. One-time revaccination 5 years after the first dose of PPSV23 is recommended for people aged 19-64 years who have chronic kidney failure, nephrotic syndrome, asplenia, or immunocompromised conditions. People who received 1-2 doses of PPSV23 before age 13 years should receive another dose of PPSV23 vaccine at age 73 years or later if at least 5 years have passed since the previous dose. Doses of PPSV23 are not needed for people immunized with PPSV23 at or after age 37 years.  Meningococcal vaccine. Adults with  asplenia or persistent complement component deficiencies should receive 2 doses of quadrivalent meningococcal conjugate (MenACWY-D) vaccine. The doses should be obtained at least 2 months apart. Microbiologists working with certain meningococcal bacteria, [place] recruits, people at risk during an outbreak, and people who travel to or live in countries with a high rate of meningitis should be immunized. A first-year college student up through age 91 years who is living in a residence hall should receive a dose if he did not receive a dose on or  after his 16th birthday. Adults who have certain high-risk conditions should receive one or more doses of vaccine.  Hepatitis A vaccine. Adults who wish to be protected from this disease, have certain high-risk conditions, work with hepatitis A-infected animals, work in hepatitis A research labs, or travel to or work in countries with a high rate of hepatitis A should be immunized. Adults who were previously unvaccinated and who anticipate close contact with an international adoptee during the first 60 days after arrival in the [place] from a country with a high rate of hepatitis A should be immunized.  Hepatitis B vaccine. Adults should be immunized if they wish to be protected from this disease, have certain high-risk conditions, may be exposed to blood or other infectious body fluids, are household contacts or sex partners of hepatitis B positive people, are clients or workers in certain care facilities, or travel to or work in countries with a high rate of hepatitis B.  Haemophilus influenzae type b (Hib) vaccine. A previously unvaccinated person with asplenia or sickle cell disease or having a scheduled splenectomy should receive 1 dose of Hib vaccine. Regardless of previous immunization, a recipient of a hematopoietic stem cell transplant should receive a 3-dose series 6-12 months after his successful transplant. Hib vaccine is not recommended for adults  with HIV infection. Preventive Service / Frequency Ages 36 to 71  Blood pressure check.** / Every 1 to 2 years.  Lipid and cholesterol check.** / Every 5 years beginning at age 43.  Hepatitis C blood test.** / For any individual with known risks for hepatitis C.  Skin self-exam. / Monthly.  Influenza vaccine. / Every year.  Tetanus, diphtheria, and acellular pertussis (Tdap, Td) vaccine.** / Consult your health care provider. 1 dose of Td every 10 years.  Varicella vaccine.** / Consult your health care provider.  HPV vaccine. / 3 doses over 6 months, if 39 or younger.  Measles, mumps, rubella (MMR) vaccine.** / You need at least 1 dose of MMR if you were born in 1957 or later. You may also need a second dose.  Pneumococcal 13-valent conjugate (PCV13) vaccine.** / Consult your health care provider.  Pneumococcal polysaccharide (PPSV23) vaccine.** / 1 to 2 doses if you smoke cigarettes or if you have certain conditions.  Meningococcal vaccine.** / 1 dose if you are age 18 to 92 years and a Market researcher living in a residence hall, or have one of several medical conditions. You may also need additional booster doses.  Hepatitis A vaccine.** / Consult your health care provider.  Hepatitis B vaccine.** / Consult your health care provider.  Haemophilus influenzae type b (Hib) vaccine.** / Consult your health care provider. Ages 60 to 51  Blood pressure check.** / Every 1 to 2 years.  Lipid and cholesterol check.** / Every 5 years beginning at age 28.  Lung cancer screening. / Every year if you are aged 7-80 years and have a 30-pack-year history of smoking and currently smoke or have quit within the past 15 years. Yearly screening is stopped once you have quit smoking for at least 15 years or develop a health problem that would prevent you from having lung cancer treatment.  Fecal occult blood test (FOBT) of stool. / Every year beginning at age 18 and continuing until  age 58. You may not have to do this test if you get a colonoscopy every 10 years.  Flexible sigmoidoscopy** or colonoscopy.** / Every 5 years for a flexible sigmoidoscopy or every  10 years for a colonoscopy beginning at age 57 and continuing until age 90.  Hepatitis C blood test.** / For all people born from [birth year] through 1966 and any individual with known risks for hepatitis C.  Skin self-exam. / Monthly.  Influenza vaccine. / Every year.  Tetanus, diphtheria, and acellular pertussis (Tdap/Td) vaccine.** / Consult your health care provider. 1 dose of Td every 10 years.  Varicella vaccine.** / Consult your health care provider.  Zoster vaccine.** / 1 dose for adults aged 22 years or older.  Measles, mumps, rubella (MMR) vaccine.** / You need at least 1 dose of MMR if you were born in 1958 or later. You may also need a second dose.  Pneumococcal 13-valent conjugate (PCV13) vaccine.** / Consult your health care provider.  Pneumococcal polysaccharide (PPSV23) vaccine.** / 1 to 2 doses if you smoke cigarettes or if you have certain conditions.  Meningococcal vaccine.** / Consult your health care provider.  Hepatitis A vaccine.** / Consult your health care provider.  Hepatitis B vaccine.** / Consult your health care provider.  Haemophilus influenzae type b (Hib) vaccine.** / Consult your health care provider. Ages 80 and over  Blood pressure check.** / Every 1 to 2 years.  Lipid and cholesterol check.**/ Every 5 years beginning at age 82.  Lung cancer screening. / Every year if you are aged 25-80 years and have a 30-pack-year history of smoking and currently smoke or have quit within the past 15 years. Yearly screening is stopped once you have quit smoking for at least 15 years or develop a health problem that would prevent you from having lung cancer treatment.  Fecal occult blood test (FOBT) of stool. / Every year beginning at age 74 and continuing until age 64. You may not have to  do this test if you get a colonoscopy every 10 years.  Flexible sigmoidoscopy** or colonoscopy.** / Every 5 years for a flexible sigmoidoscopy or every 10 years for a colonoscopy beginning at age 21 and continuing until age 45.  Hepatitis C blood test.** / For all people born from [birth year] through 1966 and any individual with known risks for hepatitis C.  Abdominal aortic aneurysm (AAA) screening.** / A one-time screening for ages 39 to 13 years who are current or former smokers.  Skin self-exam. / Monthly.  Influenza vaccine. / Every year.  Tetanus, diphtheria, and acellular pertussis (Tdap/Td) vaccine.** / 1 dose of Td every 10 years.  Varicella vaccine.** / Consult your health care provider.  Zoster vaccine.** / 1 dose for adults aged 69 years or older.  Pneumococcal 13-valent conjugate (PCV13) vaccine.** / Consult your health care provider.  Pneumococcal polysaccharide (PPSV23) vaccine.** / 1 dose for all adults aged 34 years and older.  Meningococcal vaccine.** / Consult your health care provider.  Hepatitis A vaccine.** / Consult your health care provider.  Hepatitis B vaccine.** / Consult your health care provider.  Haemophilus influenzae type b (Hib) vaccine.** / Consult your health care provider. **Family history and personal history of risk and conditions may change your health care provider's recommendations. Document Released: 06/30/2001 Document Revised: 05/09/2013 Document Reviewed: 09/29/2010 [facility] Patient Information 2015 [place], [place]. This information is not intended to replace advice given to you by your health care provider. Make sure you discuss any questions you have with your health care provider.

## 2014-11-12 NOTE — Progress Notes (Signed)
Pre visit review using our clinic review tool, if applicable. No additional management support is needed unless otherwise documented below in the visit note. 

## 2014-11-12 NOTE — Progress Notes (Signed)
Subjective:    Roger Williams is a 74 y.o. male who presents for Medicare Annual/Subsequent preventive examination.  Will also be getting CPE today.  Is fasting for labs.   Preventive Screening-Counseling & Management  Tobacco History  Smoking status  . Never Smoker   Smokeless tobacco  . Never Used   Problems Prior to Visit 1. GERD -- Well controlled with Ranitidine PRN.  Denies daily or breakthrough symptoms.  Is watching diet.  No acute concerns at today's visit.  Current Problems (verified) Patient Active Problem List   Diagnosis Date Noted  . Encounter for preventive health examination 12/20/2012    Medications Prior to Visit Current Outpatient Prescriptions on File Prior to Visit  Medication Sig Dispense Refill  . Ranitidine HCl (ACID CONTROL PO) Take 1 each by mouth daily. OTC Acutate     No current facility-administered medications on file prior to visit.    Current Medications (verified) Current Outpatient Prescriptions  Medication Sig Dispense Refill  . Ranitidine HCl (ACID CONTROL PO) Take 1 each by mouth daily. OTC Acutate     No current facility-administered medications for this visit.     Allergies (verified) Review of patient's allergies indicates no known allergies.   PAST HISTORY  Family History Family History  Problem Relation Age of Onset  . Parkinson's disease Father   . Parkinson's disease Brother     Social History History  Substance Use Topics  . Smoking status: Never Smoker   . Smokeless tobacco: Never Used  . Alcohol Use: No   Are there smokers in your home (other than you)?  No  Risk Factors Current exercise habits: Home exercise routine includes calisthenics and walking 1 hrs per days.  Dietary issues discussed: well-balanced diet. Body mass index is 26.52 kg/(m^2).   Cardiac risk factors: advanced age (older than 79 for men, 37 for women), family history of premature cardiovascular disease and male gender.  Depression  Screen (Note: if answer to either of the following is "Yes", a more complete depression screening is indicated)   Q1: Over the past two weeks, have you felt down, depressed or hopeless? No  Q2: Over the past two weeks, have you felt little interest or pleasure in doing things? No  Have you lost interest or pleasure in daily life? No  Do you often feel hopeless? No  Do you cry easily over simple problems? No  Activities of Daily Living In your present state of health, do you have any difficulty performing the following activities?:  Driving? No Managing money?  No Feeding yourself? No Getting from bed to chair? No Climbing a flight of stairs? No Preparing food and eating?: No Bathing or showering? No Getting dressed: No Getting to the toilet? No Using the toilet:No Moving around from place to place: No In the past year have you fallen or had a near fall?:No   Are you sexually active?  Yes  Do you have more than one partner?  No  Hearing Difficulties: No Do you often ask people to speak up or repeat themselves? No Do you experience ringing or noises in your ears? No Do you have difficulty understanding soft or whispered voices? No   Do you feel that you have a problem with memory? No  Do you often misplace items? No  Do you feel safe at home?  Yes  Cognitive Testing  Alert? Yes  Normal Appearance?Yes  Oriented to person? Yes  Place? Yes   Time? Yes  Recall of three  objects?  Yes  Can perform simple calculations? Yes  Displays appropriate judgment?Yes  Can read the correct time from a watch face?Yes   Advanced Directives have been discussed with the patient? Yes   List the Names of Other Physician/Practitioners you currently use: 1.  None per patient.  Indicate any recent Medical Services you may have received from other than [facility] providers in the past year (date may be approximate).  Immunization History  Administered Date(s) Administered  . Influenza-Unspecified  02/01/2013, 03/21/2014  . Tdap 10/26/2012  . Zoster 03/21/2013    Screening Tests Health Maintenance  Topic Date Due  . COLONOSCOPY  02/11/1990  . PNA vac Low Risk Adult (1 of 2 - PCV13) 02/11/2005  . INFLUENZA VACCINE  12/17/2014  . TETANUS/TDAP  10/27/2022  . ZOSTAVAX  Completed    All answers were reviewed with the patient and necessary referrals were made:  Piedad Climes, PA-C   11/12/2014   History reviewed: allergies, current medications, past family history, past medical history, past social history, past surgical history and problem list  Review of Systems A comprehensive review of systems was negative.    Objective:     Blood pressure 148/79, pulse 63, temperature 98.6 F (37 C), temperature source Oral, height 5\' 10"  (1.778 m), weight 184 lb 12.8 oz (83.825 kg), SpO2 96 %. Body mass index is 26.52 kg/(m^2).  General appearance: alert, cooperative and appears stated age Head: Normocephalic, without obvious abnormality, atraumatic Eyes: conjunctivae/corneas clear. PERRL, EOM's intact. Fundi benign. Ears: normal TM's and external ear canals both ears Nose: Nares normal. Septum midline. Mucosa normal. No drainage or sinus tenderness. Throat: lips, mucosa, and tongue normal; teeth and gums normal Lungs: clear to auscultation bilaterally Chest wall: no tenderness Heart: regular rate and rhythm, S1, S2 normal, no murmur, click, rub or gallop Abdomen: soft, non-tender; bowel sounds normal; no masses,  no organomegaly Male genitalia: normal, deferred Rectal: deferred Extremities: extremities normal, atraumatic, no cyanosis or edema Pulses: 2+ and symmetric Skin: Skin color, texture, turgor normal. No rashes or lesions Lymph nodes: Cervical, supraclavicular, and axillary nodes normal. Neurologic: Alert and oriented X 3, normal strength and tone. Normal symmetric reflexes. Normal coordination and gait     Assessment:     (1) Medicate Wellness,  Subsequent. (2) Annual Physical Exam (3) Screening for Ischemic Heart Disease (4) Prostate Cancer Screening (5) GERD      Plan:     (1) During the course of the visit the patient was educated and counseled about appropriate screening and preventive services including:    Pneumococcal vaccine   Screening electrocardiogram  Prostate cancer screening  Colorectal cancer screening  Diabetes screening  Nutrition counseling   Advanced directives: has an advanced directive - a copy HAS NOT been provided.  Diet review for nutrition referral? Yes ____  Not Indicated _x___  (2) Depression screen negative.  Health Maintenance reviewed. Prevnar given by nursing staff. Preventive Schedule reviewed.  Handout given in AVS.  (3) EKG reveals sinus bradycardia at 59 bpm.  Asymptomatic. Will begin ASA 81 mg daily. Will obtain lipid panel today.  (4) Risks versus benefits discussed. DRE deferred.  Patient 64 but very healthy for age.  Will proceed with PSA testing.  (5) Well-controlled.  Continue current regimen.  Patient Instructions (the written plan) was given to the patient.  Medicare Attestation I have personally reviewed: The patient's medical and social history Their use of alcohol, tobacco or illicit drugs Their current medications and supplements The patient's  functional ability including ADLs,fall risks, home safety risks, cognitive, and hearing and visual impairment Diet and physical activities Evidence for depression or mood disorders  The patient's weight, height, BMI, and visual acuity have been recorded in the chart.  I have made referrals, counseling, and provided education to the patient based on review of the above and I have provided the patient with a written personalized care plan for preventive services.     Marcelline Mates [place], [place]   11/12/2014

## 2014-11-13 ENCOUNTER — Encounter: Payer: Self-pay | Admitting: *Deleted

## 2015-01-04 ENCOUNTER — Encounter: Payer: Self-pay | Admitting: Physician Assistant

## 2015-01-04 ENCOUNTER — Ambulatory Visit (INDEPENDENT_AMBULATORY_CARE_PROVIDER_SITE_OTHER): Payer: Medicare Other | Admitting: Physician Assistant

## 2015-01-04 VITALS — BP 150/74 | HR 57 | Temp 97.5°F | Ht 70.0 in | Wt 184.6 lb

## 2015-01-04 DIAGNOSIS — L255 Unspecified contact dermatitis due to plants, except food: Secondary | ICD-10-CM | POA: Diagnosis not present

## 2015-01-04 MED ORDER — METHYLPREDNISOLONE 4 MG PO TBPK
ORAL_TABLET | ORAL | Status: DC
Start: 2015-01-04 — End: 2015-04-24

## 2015-01-04 MED ORDER — METHYLPREDNISOLONE ACETATE 80 MG/ML IJ SUSP
80.0000 mg | Freq: Once | INTRAMUSCULAR | Status: AC
  Administered 2015-01-04: 80 mg via INTRAMUSCULAR

## 2015-01-04 NOTE — Addendum Note (Signed)
Addended by: Verdie Shire on: 01/04/2015 07:43 AM   Modules accepted: Orders

## 2015-01-04 NOTE — Progress Notes (Signed)
Pre visit review using our clinic review tool, if applicable. No additional management support is needed unless otherwise documented below in the visit note. 

## 2015-01-04 NOTE — Assessment & Plan Note (Signed)
Im depo medrol given. Continue Betamethasone cream BID. Supportive measures reviewed. Rx Medrol pack to begin over weekend if symptoms worsening. Follow-up PRN if symptoms are not resolving.

## 2015-01-04 NOTE — Progress Notes (Signed)
Patient presents to clinic today c/o exposure to poison ivy with rash on bilateral upper extremities and torso. Rash began on Tuesday and has worsened. Patient endorses itching but denies pain. Denies fever, chills, malaise or fatigue. Has been using his steroid cream with some relief in symptoms.  No past medical history on file.  Current Outpatient Prescriptions on File Prior to Visit  Medication Sig Dispense Refill  . Ranitidine HCl (ACID CONTROL PO) Take 1 each by mouth daily. OTC Acutate     No current facility-administered medications on file prior to visit.    No Known Allergies  Family History  Problem Relation Age of Onset  . Parkinson's disease Father   . Parkinson's disease Brother     Social History   Social History  . Marital Status: Married    Spouse Name: N/A  . Number of Children: N/A  . Years of Education: N/A   Social History Main Topics  . Smoking status: Never Smoker   . Smokeless tobacco: Never Used  . Alcohol Use: No  . Drug Use: No  . Sexual Activity: No   Other Topics Concern  . None   Social History Narrative    Review of Systems - See HPI.  All other ROS are negative.  BP 150/74 mmHg  Pulse 57  Temp(Src) 97.5 F (36.4 C) (Oral)  Ht 5\' 10"  (1.778 m)  Wt 184 lb 9.6 oz (83.734 kg)  BMI 26.49 kg/m2  SpO2 96%  Physical Exam  Constitutional: He is oriented to person, place, and time and well-developed, well-nourished, and in no distress.  HENT:  Head: Normocephalic and atraumatic.  Cardiovascular: Normal rate, regular rhythm and normal heart sounds.   Pulmonary/Chest: Effort normal.  Neurological: He is alert and oriented to person, place, and time.  Skin: Skin is warm and dry. Rash noted.  Psychiatric: Affect normal.  Vitals reviewed.   Recent Results (from the past 2160 hour(s))  Urinalysis, Routine w reflex microscopic (not at [facility])     Status: None   Collection Time: 11/12/14  8:36 AM  Result Value Ref Range   Color,  Urine YELLOW Yellow;Lt. Yellow   APPearance CLEAR Clear   Specific Gravity, Urine 1.025 1.000-1.030   pH 6.0 5.0 - 8.0   Total Protein, Urine NEGATIVE Negative   Urine Glucose NEGATIVE Negative   Ketones, ur NEGATIVE Negative   Bilirubin Urine NEGATIVE Negative   Hgb urine dipstick NEGATIVE Negative   Urobilinogen, UA 0.2 0.0 - 1.0   Leukocytes, UA NEGATIVE Negative   Nitrite NEGATIVE Negative   WBC, UA 0-2/hpf 0-2/hpf   RBC / HPF none seen 0-2/hpf   Squamous Epithelial / LPF Rare(0-4/hpf) Rare(0-4/hpf)  Basic metabolic panel     Status: Abnormal   Collection Time: 11/12/14  8:36 AM  Result Value Ref Range   Sodium 139 135 - 145 mEq/L   Potassium 4.5 3.5 - 5.1 mEq/L   Chloride 105 96 - 112 mEq/L   CO2 28 19 - 32 mEq/L   Glucose, Bld 102 (H) 70 - 99 mg/dL   BUN 15 6 - 23 mg/dL   Creatinine, Ser 7.82 0.40 - 1.50 mg/dL   Calcium 9.4 8.4 - 95.6 mg/dL   GFR 21.30 >86.57 mL/min  Hepatic function panel     Status: None   Collection Time: 11/12/14  8:36 AM  Result Value Ref Range   Total Bilirubin 0.6 0.2 - 1.2 mg/dL   Bilirubin, Direct 0.1 0.0 - 0.3 mg/dL  Alkaline Phosphatase 65 39 - 117 U/L   AST 23 0 - 37 U/L   ALT 21 0 - 53 U/L   Total Protein 6.6 6.0 - 8.3 g/dL   Albumin 4.1 3.5 - 5.2 g/dL  Lipid panel     Status: Abnormal   Collection Time: 11/12/14  8:36 AM  Result Value Ref Range   Cholesterol 159 0 - 200 mg/dL    Comment: ATP III Classification       Desirable:  < 200 mg/dL               Borderline High:  200 - 239 mg/dL          High:  > = 161 mg/dL   Triglycerides 09.6 0.0 - 149.0 mg/dL    Comment: Normal:  <045 mg/dLBorderline High:  150 - 199 mg/dL   HDL 40.98 >11.91 mg/dL   VLDL 47.8 0.0 - 29.5 mg/dL   LDL Cholesterol 621 (H) 0 - 99 mg/dL   Total CHOL/HDL Ratio 4     Comment:                Men          Women1/2 Average Risk     3.4          3.3Average Risk          5.0          4.42X Average Risk          9.6          7.13X Average Risk          15.0           11.0                       NonHDL 118.80     Comment: NOTE:  Non-HDL goal should be 30 mg/dL higher than patient's LDL goal (i.e. LDL goal of < 70 mg/dL, would have non-HDL goal of < 100 mg/dL)  PSA     Status: None   Collection Time: 11/12/14  8:36 AM  Result Value Ref Range   PSA 1.95 0.10 - 4.00 ng/mL  CBC     Status: Abnormal   Collection Time: 11/12/14  8:36 AM  Result Value Ref Range   WBC 5.2 4.0 - 10.5 K/uL   RBC 5.09 4.22 - 5.81 Mil/uL   Platelets 148.0 (L) 150.0 - 400.0 K/uL   Hemoglobin 16.3 13.0 - 17.0 g/dL   HCT 30.8 65.7 - 84.6 %   MCV 95.3 78.0 - 100.0 fl   MCHC 33.5 30.0 - 36.0 g/dL   RDW 96.2 95.2 - 84.1 %    Assessment/Plan: No problem-specific assessment & plan notes found for this encounter.

## 2015-01-04 NOTE — Patient Instructions (Signed)
Please continue the betamethasone cream twice daily as directed. Shot given today will help alleviate your symptoms and resolve the rash. If symptoms are not improving over the weekend, fill the steroid pack I have sent to your pharmacy, and take as directed.  Please wear gloves and long sleeves when working in the garden. Sarna lotion and daily claritin may be beneficial for itch. Witch hazel will help dry the rash up. Call or return to clinic if symptoms are not resolving.

## 2015-04-24 ENCOUNTER — Encounter: Payer: Self-pay | Admitting: Physician Assistant

## 2015-04-24 ENCOUNTER — Ambulatory Visit (INDEPENDENT_AMBULATORY_CARE_PROVIDER_SITE_OTHER): Payer: Medicare Other | Admitting: Physician Assistant

## 2015-04-24 VITALS — BP 144/80 | HR 65 | Temp 97.7°F | Ht 70.0 in | Wt 186.2 lb

## 2015-04-24 DIAGNOSIS — T7840XA Allergy, unspecified, initial encounter: Secondary | ICD-10-CM

## 2015-04-24 MED ORDER — CEPHALEXIN 500 MG PO CAPS: 500.0000 mg | ORAL_CAPSULE | Freq: Two times a day (BID) | ORAL | Status: DC

## 2015-04-24 MED ORDER — TRIAMCINOLONE ACETONIDE 0.1 % EX CREA: 1.0000 "application " | TOPICAL_CREAM | Freq: Two times a day (BID) | CUTANEOUS | Status: DC

## 2015-04-24 NOTE — Patient Instructions (Signed)
Please stop the Mupirocin ointment. Use steroid cream twice daily.  Keep cool compresses on the area. Take a Zantac in the morning and a Claritin at bedtime to help with itch. Sarna lotion (over-the-counter) will also help itch.  If you notice tenderness or hardness of the skin, take the antibiotic as directed and follow-up with me in office once completed.

## 2015-04-24 NOTE — Progress Notes (Signed)
Pre visit review using our clinic review tool, if applicable. No additional management support is needed unless otherwise documented below in the visit note. 

## 2015-04-24 NOTE — Progress Notes (Signed)
    Patient presents to clinic today c/o 2 days of redness, warmth and pruritus of skin of R biceps after applying topical Mupirocin to a bump there. Denies any rash. Denies pain. Notes skin is slightly swollen at site of redness. Denies SOB, rapid heart rate or hives.  No past medical history on file.  Current Outpatient Prescriptions on File Prior to Visit  Medication Sig Dispense Refill  . aspirin 81 MG tablet Take 81 mg by mouth daily.    . Ranitidine HCl (ACID CONTROL PO) Take 1 each by mouth daily. OTC Acutate     No current facility-administered medications on file prior to visit.    No Known Allergies  Family History  Problem Relation Age of Onset  . Parkinson's disease Father   . Parkinson's disease Brother     Social History   Social History  . Marital Status: Married    Spouse Name: N/A  . Number of Children: N/A  . Years of Education: N/A   Social History Main Topics  . Smoking status: Never Smoker   . Smokeless tobacco: Never Used  . Alcohol Use: No  . Drug Use: No  . Sexual Activity: No   Other Topics Concern  . None   Social History Narrative    Review of Systems - See HPI.  All other ROS are negative.  BP 144/80 mmHg  Pulse 65  Temp(Src) 97.7 F (36.5 C) (Oral)  Ht 5\' 10"  (1.778 m)  Wt 186 lb 3.2 oz (84.46 kg)  BMI 26.72 kg/m2  SpO2 98%  Physical Exam  Constitutional: He is oriented to person, place, and time and well-developed, well-nourished, and in no distress.  HENT:  Head: Normocephalic and atraumatic.  Cardiovascular: Normal rate and regular rhythm.   Pulmonary/Chest: Effort normal and breath sounds normal. No respiratory distress. He has no wheezes. He has no rales. He exhibits no tenderness.  Neurological: He is alert and oriented to person, place, and time.  Skin: Skin is warm and dry.     Vitals reviewed.   No results found for this or any previous visit (from the past 2160 hour(s)).  Assessment/Plan: Allergic  reaction To Mupirocin. Stop cream. Will begin kenalog ointment BID. Zantac Am and Claritin PM. Sarna lotion and cool compresses. Rx Keflex printed and given to patient only to fill or use if induration and tenderness occurs.

## 2015-04-24 NOTE — Assessment & Plan Note (Signed)
To Mupirocin. Stop cream. Will begin kenalog ointment BID. Zantac Am and Claritin PM. Sarna lotion and cool compresses. Rx Keflex printed and given to patient only to fill or use if induration and tenderness occurs.

## 2015-06-11 DIAGNOSIS — I10 Essential (primary) hypertension: Secondary | ICD-10-CM | POA: Diagnosis not present

## 2015-06-11 DIAGNOSIS — H2513 Age-related nuclear cataract, bilateral: Secondary | ICD-10-CM | POA: Diagnosis not present

## 2015-06-11 DIAGNOSIS — H5203 Hypermetropia, bilateral: Secondary | ICD-10-CM | POA: Diagnosis not present

## 2015-06-11 DIAGNOSIS — H35033 Hypertensive retinopathy, bilateral: Secondary | ICD-10-CM | POA: Diagnosis not present

## 2015-06-17 ENCOUNTER — Ambulatory Visit (INDEPENDENT_AMBULATORY_CARE_PROVIDER_SITE_OTHER): Payer: Medicare Other | Admitting: Physician Assistant

## 2015-06-17 VITALS — BP 148/70 | HR 55 | Temp 97.5°F | Resp 14 | Ht 70.0 in | Wt 185.2 lb

## 2015-06-17 DIAGNOSIS — K219 Gastro-esophageal reflux disease without esophagitis: Secondary | ICD-10-CM | POA: Diagnosis not present

## 2015-06-17 MED ORDER — OMEPRAZOLE MAGNESIUM 20 MG PO TBEC: 20.0000 mg | DELAYED_RELEASE_TABLET | Freq: Every day | ORAL | Status: DC

## 2015-06-17 NOTE — Patient Instructions (Signed)
Please stay well hydrated and follow the diet recommendations below. Start the Prilosec daily over the next 2 weeks.  A prescription has been sent in. Start a daily probiotic (Culturelle, Digestive Advantage, One A Day)  Follow-up with me in 2 weeks. Return immediately if anything worsens.  Food Choices for Gastroesophageal Reflux Disease, Adult When you have gastroesophageal reflux disease (GERD), the foods you eat and your eating habits are very important. Choosing the right foods can help ease the discomfort of GERD. WHAT GENERAL GUIDELINES DO I NEED TO FOLLOW?  Choose fruits, vegetables, whole grains, low-fat dairy products, and low-fat meat, fish, and poultry.  Limit fats such as oils, salad dressings, butter, nuts, and avocado.  Keep a food diary to identify foods that cause symptoms.  Avoid foods that cause reflux. These may be different for different people.  Eat frequent small meals instead of three large meals each day.  Eat your meals slowly, in a relaxed setting.  Limit fried foods.  Cook foods using methods other than frying.  Avoid drinking alcohol.  Avoid drinking large amounts of liquids with your meals.  Avoid bending over or lying down until 2-3 hours after eating. WHAT FOODS ARE NOT RECOMMENDED? The following are some foods and drinks that may worsen your symptoms: Vegetables Tomatoes. Tomato juice. Tomato and spaghetti sauce. Chili peppers. Onion and garlic. Horseradish. Fruits Oranges, grapefruit, and lemon (fruit and juice). Meats High-fat meats, fish, and poultry. This includes hot dogs, ribs, ham, sausage, salami, and bacon. Dairy Whole milk and chocolate milk. Sour cream. Cream. Butter. Ice cream. Cream cheese.  Beverages Coffee and tea, with or without caffeine. Carbonated beverages or energy drinks. Condiments Hot sauce. Barbecue sauce.  Sweets/Desserts Chocolate and cocoa. Donuts. Peppermint and spearmint. Fats and Oils High-fat foods,  including [place] fries and potato chips. Other Vinegar. Strong spices, such as black pepper, white pepper, red pepper, cayenne, curry powder, cloves, ginger, and chili powder. The items listed above may not be a complete list of foods and beverages to avoid. Contact your dietitian for more information.   This information is not intended to replace advice given to you by your health care provider. Make sure you discuss any questions you have with your health care provider.   Document Released: 05/04/2005 Document Revised: 05/25/2014 Document Reviewed: 03/08/2013 Elsevier Interactive Patient Education [employer]

## 2015-06-17 NOTE — Progress Notes (Signed)
   Patient presents to clinic today c/o multiple gastroenterological complaints starting one week ago. Endorses 2-3 days of non-bloody diarrhea that have resolved. Endorses left with a bloating sensation and worsening heart burn. Denies abdominal pain. Denies nausea or vomiting. Denies recent travel or sick contact. Denies trying new foods. Has been taking zantac with only little relief in symptoms. Did get some relief from a Pepto Bismol tablet last night.  No past medical history on file.  Current Outpatient Prescriptions on File Prior to Visit  Medication Sig Dispense Refill  . aspirin 81 MG tablet Take 81 mg by mouth daily.    . Ranitidine HCl (ACID CONTROL PO) Take 1 each by mouth daily. OTC Acutate    . triamcinolone cream (KENALOG) 0.1 % Apply 1 application topically 2 (two) times daily. 30 g 0   No current facility-administered medications on file prior to visit.    Allergies  Allergen Reactions  . Mupirocin Rash    Family History  Problem Relation Age of Onset  . Parkinson's disease Father   . Parkinson's disease Brother     Social History   Social History  . Marital Status: Married    Spouse Name: N/A  . Number of Children: N/A  . Years of Education: N/A   Social History Main Topics  . Smoking status: Never Smoker   . Smokeless tobacco: Never Used  . Alcohol Use: No  . Drug Use: No  . Sexual Activity: No   Other Topics Concern  . Not on file   Social History Narrative   Review of Systems - See HPI.  All other ROS are negative.  BP 148/70 mmHg  Pulse 55  Temp(Src) 97.5 F (36.4 C) (Oral)  Resp 14  Ht  (1.778 m)  Wt 185 lb 3.2 oz (84.006 kg)  BMI 26.57 kg/m2  SpO2 99%  Physical Exam  Constitutional: He is oriented to person, place, and time and well-developed, well-nourished, and in no distress.  HENT:  Head: Normocephalic and atraumatic.  Cardiovascular: Normal rate, regular rhythm, normal heart sounds and intact distal pulses.     Pulmonary/Chest: Effort normal and breath sounds normal. No respiratory distress. He has no wheezes. He has no rales. He exhibits no tenderness.  Abdominal: Soft. He exhibits no distension and no mass. Bowel sounds are hyperactive. There is no tenderness. There is no rebound and no guarding.  Neurological: He is alert and oriented to person, place, and time.  Skin: Skin is warm and dry. No rash noted.  Psychiatric: Affect normal.  Vitals reviewed.  No results found for this or any previous visit (from the past 2160 hour(s)).  Assessment/Plan: Gastroesophageal reflux disease without esophagitis Worsened after a recent resolved gastroenteritis. Will do a 2 week course of Prilosec 20 mg daily. Start daily probiotic. Increase fluids. GERD diet given. Follow-up 2 weeks. Return immediately if anything worsens.

## 2015-06-17 NOTE — Assessment & Plan Note (Signed)
Worsened after a recent resolved gastroenteritis. Will do a 2 week course of Prilosec 20 mg daily. Start daily probiotic. Increase fluids. GERD diet given. Follow-up 2 weeks. Return immediately if anything worsens.

## 2015-06-17 NOTE — Progress Notes (Signed)
Pre visit review using our clinic review tool, if applicable. No additional management support is needed unless otherwise documented below in the visit note. 

## 2015-07-03 ENCOUNTER — Ambulatory Visit: Payer: Medicare Other | Admitting: Physician Assistant

## 2015-09-11 ENCOUNTER — Telehealth: Payer: Self-pay | Admitting: Physician Assistant

## 2015-09-11 ENCOUNTER — Encounter: Payer: Self-pay | Admitting: Physician Assistant

## 2015-09-11 NOTE — Telephone Encounter (Signed)
Attempted to contact patient to reschedule appointment with Malva Coganody Martin on November 15, 2015. My Chart account is pending. Letter mailed and appointment cancelled 09/11/2015.

## 2015-11-15 ENCOUNTER — Encounter: Payer: Medicare Other | Admitting: Physician Assistant

## 2015-12-25 ENCOUNTER — Ambulatory Visit (INDEPENDENT_AMBULATORY_CARE_PROVIDER_SITE_OTHER): Payer: Medicare Other | Admitting: Physician Assistant

## 2015-12-25 ENCOUNTER — Encounter: Payer: Self-pay | Admitting: Physician Assistant

## 2015-12-25 VITALS — BP 122/82 | HR 54 | Temp 97.8°F | Resp 16 | Ht 70.0 in | Wt 184.5 lb

## 2015-12-25 DIAGNOSIS — Z23 Encounter for immunization: Secondary | ICD-10-CM | POA: Diagnosis not present

## 2015-12-25 DIAGNOSIS — Z0000 Encounter for general adult medical examination without abnormal findings: Secondary | ICD-10-CM

## 2015-12-25 LAB — CBC
HEMATOCRIT: 45 % (ref 39.0–52.0)
Hemoglobin: 15.3 g/dL (ref 13.0–17.0)
MCHC: 33.9 g/dL (ref 30.0–36.0)
MCV: 93.4 fl (ref 78.0–100.0)
Platelets: 157 10*3/uL (ref 150.0–400.0)
RBC: 4.82 Mil/uL (ref 4.22–5.81)
RDW: 13.3 % (ref 11.5–15.5)
WBC: 3.9 10*3/uL — ABNORMAL LOW (ref 4.0–10.5)

## 2015-12-25 LAB — HEPATIC FUNCTION PANEL
ALK PHOS: 61 U/L (ref 39–117)
ALT: 16 U/L (ref 0–53)
AST: 22 U/L (ref 0–37)
Albumin: 4.1 g/dL (ref 3.5–5.2)
BILIRUBIN TOTAL: 0.5 mg/dL (ref 0.2–1.2)
Bilirubin, Direct: 0.1 mg/dL (ref 0.0–0.3)
Total Protein: 6.5 g/dL (ref 6.0–8.3)

## 2015-12-25 LAB — BASIC METABOLIC PANEL
BUN: 13 mg/dL (ref 6–23)
CALCIUM: 9 mg/dL (ref 8.4–10.5)
CO2: 30 mEq/L (ref 19–32)
Chloride: 106 mEq/L (ref 96–112)
Creatinine, Ser: 0.96 mg/dL (ref 0.40–1.50)
GFR: 80.97 mL/min (ref >60.00)
Glucose, Bld: 110 mg/dL — ABNORMAL HIGH (ref 70–99)
POTASSIUM: 4.9 meq/L (ref 3.5–5.1)
SODIUM: 139 meq/L (ref 135–145)

## 2015-12-25 LAB — HEMOGLOBIN A1C: HEMOGLOBIN A1C: 5.6 % (ref 4.6–6.5)

## 2015-12-25 LAB — LIPID PANEL
CHOLESTEROL: 155 mg/dL (ref 0–200)
HDL: 40.4 mg/dL (ref >39.00)
LDL CALC: 100 mg/dL — AB (ref 0–99)
NONHDL: 114.28
Total CHOL/HDL Ratio: 4
Triglycerides: 72 mg/dL (ref 0.0–149.0)
VLDL: 14.4 mg/dL (ref 0.0–40.0)

## 2015-12-25 NOTE — Progress Notes (Signed)
Subjective:    Roger Williams is a 75 y.o. male who presents for Medicare Annual/Subsequent Wellness and complete physical.  Preventive Screening-Counseling & Management  Tobacco History  Smoking Status  . Never Smoker  Smokeless Tobacco  . Never Used    Problems Prior to Visit 1. GERD -- Currently on ranitidine PRN. Endorses good control of symptoms. Is watching trigger foods.   Current Problems (verified) Patient Active Problem List   Diagnosis Date Noted  . Gastroesophageal reflux disease without esophagitis 06/17/2015  . Allergic reaction 04/24/2015  . Rhus dermatitis 01/04/2015  . Screening for ischemic heart disease 11/12/2014  . Prostate cancer screening 11/12/2014  . Routine history and physical examination of adult 11/12/2014  . Need for prophylactic vaccination against Streptococcus pneumoniae (pneumococcus) 11/12/2014  . Encounter for preventive health examination 12/20/2012    Medications Prior to Visit Current Outpatient Prescriptions on File Prior to Visit  Medication Sig Dispense Refill  . aspirin 81 MG tablet Take 81 mg by mouth daily.    Marland Kitchen omeprazole (PRILOSEC OTC) 20 MG tablet Take 1 tablet (20 mg total) by mouth daily. (Patient taking differently: Take 20 mg by mouth 3 times/day as needed-between meals & bedtime. ) 30 tablet 0  . Ranitidine HCl (ACID CONTROL PO) Take 50 mg by mouth daily. OTC Acutate      No current facility-administered medications on file prior to visit.     Current Medications (verified) Current Outpatient Prescriptions  Medication Sig Dispense Refill  . aspirin 81 MG tablet Take 81 mg by mouth daily.    Marland Kitchen omeprazole (PRILOSEC OTC) 20 MG tablet Take 1 tablet (20 mg total) by mouth daily. (Patient taking differently: Take 20 mg by mouth 3 times/day as needed-between meals & bedtime. ) 30 tablet 0  . Ranitidine HCl (ACID CONTROL PO) Take 50 mg by mouth daily. OTC Acutate      No current facility-administered medications for this visit.       Allergies (verified) Mupirocin   PAST HISTORY  Family History Family History  Problem Relation Age of Onset  . Parkinson's disease Father   . Parkinson's disease Brother     Social History Social History  Substance Use Topics  . Smoking status: Never Smoker  . Smokeless tobacco: Never Used  . Alcohol use No   Are there smokers in your home (other than you)?  No  Risk Factors Current exercise habits: Home exercise routine includes walking 1 hrs per day.  Does a lot of physical labor at home -- gardening, etc.  Dietary issues discussed: Body mass index is 26.47 kg/m. Endorses well-balanced diet. Drinks mostly water with meals. Occasional fast food but tries to limit.    Cardiac risk factors: advanced age (older than 36 for men, 8 for women) and male gender.  Depression Screen (Note: if answer to either of the following is "Yes", a more complete depression screening is indicated)   Q1: Over the past two weeks, have you felt down, depressed or hopeless? No  Q2: Over the past two weeks, have you felt little interest or pleasure in doing things? No  Have you lost interest or pleasure in daily life? No  Do you often feel hopeless? No  Do you cry easily over simple problems? No  Activities of Daily Living In your present state of health, do you have any difficulty performing the following activities?:  Driving? No Managing money?  No Feeding yourself? No Getting from bed to chair? No Climbing a flight of  stairs? No Preparing food and eating?: No Bathing or showering? No Getting dressed: No Getting to the toilet? No Using the toilet:No Moving around from place to place: No In the past year have you fallen or had a near fall?:No   Are you sexually active?  Yes  Do you have more than one partner?  No  Hearing Difficulties: No Do you often ask people to speak up or repeat themselves? No Do you experience ringing or noises in your ears? No Do you have difficulty  understanding soft or whispered voices? No   Do you feel that you have a problem with memory? No  Do you often misplace items? No  Do you feel safe at home?  Yes  Cognitive Testing  Alert? Yes  Normal Appearance?Yes  Oriented to person? Yes  Place? Yes   Time? Yes  Recall of three objects?  Yes  Can perform simple calculations? Yes  Displays appropriate judgment?Yes  Can read the correct time from a watch face?Yes   Advanced Directives have been discussed with the patient? Yes   List the Names of Other Physician/Practitioners you currently use: 1.  See EMR for comprehensive list if applicable.   Indicate any recent Medical Services you may have received from other than [facility] providers in the past year (date may be approximate).  Immunization History  Administered Date(s) Administered  . Influenza, High Dose Seasonal PF 04/10/2015  . Influenza-Unspecified 02/01/2013, 03/21/2014  . Pneumococcal Conjugate-13 11/12/2014  . Tdap 10/26/2012  . Zoster 03/21/2013    Screening Tests Health Maintenance  Topic Date Due  . PNA vac Low Risk Adult (2 of 2 - PPSV23) 11/12/2015  . INFLUENZA VACCINE  12/17/2015  . COLONOSCOPY  01/03/2021  . DTaP/Tdap/Td (2 - Td) 10/27/2022  . TETANUS/TDAP  10/27/2022  . ZOSTAVAX  Completed    All answers were reviewed with the patient and necessary referrals were made:  Piedad ClimesMartin, Morgan Rennert Cody, PA-C   12/25/2015   History reviewed: allergies, current medications, past family history, past medical history, past social history, past surgical history and problem list  Review of Systems Pertinent items noted in HPI and remainder of comprehensive ROS otherwise negative.    Objective:     Blood pressure 122/82, pulse (!) 54, temperature 97.8 F (36.6 C), temperature source Oral, resp. rate 16, height 5\' 10"  (1.778 m), weight 184 lb 8 oz (83.7 kg), SpO2 98 %. Body mass index is 26.47 kg/m.  General appearance: alert, cooperative, appears stated age and  no distress Head: Normocephalic, without obvious abnormality, atraumatic Eyes: conjunctivae/corneas clear. PERRL, EOM's intact. Fundi benign. Ears: normal TM's and external ear canals both ears Nose: Nares normal. Septum midline. Mucosa normal. No drainage or sinus tenderness. Throat: lips, mucosa, and tongue normal; teeth and gums normal Lungs: clear to auscultation bilaterally Heart: regular rate and rhythm, S1, S2 normal, no murmur, click, rub or gallop Abdomen: soft, non-tender; bowel sounds normal; no masses,  no organomegaly Extremities: extremities normal, atraumatic, no cyanosis or edema Pulses: 2+ and symmetric Skin: Skin color, texture, turgor normal. No rashes or lesions Neurologic: Alert and oriented X 3, normal strength and tone. Normal symmetric reflexes. Normal coordination and gait     Assessment:     (1) Medicare Wellness, Subsequent (2) Need for vaccination to Strep Pneumoniae     Plan:     (1) During the course of the visit the patient was educated and counseled about appropriate screening and preventive services including:    Pneumococcal vaccine  Prostate cancer screening - Declines prostate cancer screening after discussion of risks and benefits along with age.   Diabetes screening  Nutrition counseling   Advanced directives: has an advanced directive - a copy HAS NOT been provided.   (2) Pneumovax given by nursing staff.  Patient Instructions (the written plan) was given to the patient.  Medicare Attestation I have personally reviewed: The patient's medical and social history Their use of alcohol, tobacco or illicit drugs Their current medications and supplements The patient's functional ability including ADLs,fall risks, home safety risks, cognitive, and hearing and visual impairment Diet and physical activities Evidence for depression or mood disorders  The patient's weight, height, BMI, and visual acuity have been recorded in the chart.   I have made referrals, counseling, and provided education to the patient based on review of the above and I have provided the patient with a written personalized care plan for preventive services.     Marcelline Mates [place], [place]   12/25/2015

## 2015-12-25 NOTE — Addendum Note (Signed)
Addended by: Regis BillSCATES, SHARON L on: 12/25/2015 09:14 AM   Modules accepted: Orders

## 2015-12-25 NOTE — Patient Instructions (Addendum)

## 2015-12-25 NOTE — Progress Notes (Signed)
Pre visit review using our clinic review tool, if applicable. No additional management support is needed unless otherwise documented below in the visit note/SLS  

## 2016-02-14 ENCOUNTER — Telehealth: Payer: Self-pay | Admitting: *Deleted

## 2016-02-14 NOTE — Telephone Encounter (Signed)
[facility] given on 02/10/16; documentation complete/SLS 09/29

## 2016-04-27 ENCOUNTER — Telehealth: Payer: Self-pay | Admitting: Physician Assistant

## 2016-04-27 ENCOUNTER — Encounter: Payer: Self-pay | Admitting: Physician Assistant

## 2016-04-27 NOTE — Telephone Encounter (Signed)
Pt states that he went to the dentist for a cleaning and was told that he needed an abx before he could have this done or a letter from his pcp that this is not needed.

## 2016-04-27 NOTE — Telephone Encounter (Signed)
Spoke with patient and he denies any bacterial endocarditis or artificial heart valves. Advised patient he wouldn't need any abx for history of joint replacements.  He needed something on file. His dental is: [facility] [phone] Attn: Casimer BilisSerena

## 2016-04-27 NOTE — Telephone Encounter (Signed)
Verify with patient but I do not believe he has any history of bacterial endocarditis or artificial heart valves. Antibiotics are not needed before cleanings in patient with just history of joint replacements. Who is his dentist so I can give them a call, discuss and fax over letter if needed.

## 2016-04-28 ENCOUNTER — Encounter: Payer: Self-pay | Admitting: Physician Assistant

## 2016-04-28 NOTE — Telephone Encounter (Signed)
Advised patient the letter was faxed to the Dentist. He could call and schedule an appointment for cleaning.

## 2016-04-28 NOTE — Telephone Encounter (Signed)
Letter written and printed.  Ok to fax in and contact patient.

## 2016-07-30 DIAGNOSIS — H524 Presbyopia: Secondary | ICD-10-CM | POA: Diagnosis not present

## 2016-07-30 DIAGNOSIS — H2513 Age-related nuclear cataract, bilateral: Secondary | ICD-10-CM | POA: Diagnosis not present

## 2016-07-30 DIAGNOSIS — H35033 Hypertensive retinopathy, bilateral: Secondary | ICD-10-CM | POA: Diagnosis not present

## 2017-08-30 DIAGNOSIS — M5441 Lumbago with sciatica, right side: Secondary | ICD-10-CM | POA: Diagnosis not present

## 2017-08-30 DIAGNOSIS — M62830 Muscle spasm of back: Secondary | ICD-10-CM | POA: Diagnosis not present

## 2017-08-30 DIAGNOSIS — M47816 Spondylosis without myelopathy or radiculopathy, lumbar region: Secondary | ICD-10-CM | POA: Diagnosis not present

## 2017-08-30 DIAGNOSIS — M9903 Segmental and somatic dysfunction of lumbar region: Secondary | ICD-10-CM | POA: Diagnosis not present

## 2017-08-31 DIAGNOSIS — M9903 Segmental and somatic dysfunction of lumbar region: Secondary | ICD-10-CM | POA: Diagnosis not present

## 2017-08-31 DIAGNOSIS — M47816 Spondylosis without myelopathy or radiculopathy, lumbar region: Secondary | ICD-10-CM | POA: Diagnosis not present

## 2017-08-31 DIAGNOSIS — M5441 Lumbago with sciatica, right side: Secondary | ICD-10-CM | POA: Diagnosis not present

## 2017-08-31 DIAGNOSIS — M62830 Muscle spasm of back: Secondary | ICD-10-CM | POA: Diagnosis not present

## 2017-09-01 DIAGNOSIS — M9903 Segmental and somatic dysfunction of lumbar region: Secondary | ICD-10-CM | POA: Diagnosis not present

## 2017-09-01 DIAGNOSIS — M47816 Spondylosis without myelopathy or radiculopathy, lumbar region: Secondary | ICD-10-CM | POA: Diagnosis not present

## 2017-09-01 DIAGNOSIS — M5441 Lumbago with sciatica, right side: Secondary | ICD-10-CM | POA: Diagnosis not present

## 2017-09-01 DIAGNOSIS — M62830 Muscle spasm of back: Secondary | ICD-10-CM | POA: Diagnosis not present

## 2017-09-02 DIAGNOSIS — M5441 Lumbago with sciatica, right side: Secondary | ICD-10-CM | POA: Diagnosis not present

## 2017-09-02 DIAGNOSIS — M47816 Spondylosis without myelopathy or radiculopathy, lumbar region: Secondary | ICD-10-CM | POA: Diagnosis not present

## 2017-09-02 DIAGNOSIS — M9903 Segmental and somatic dysfunction of lumbar region: Secondary | ICD-10-CM | POA: Diagnosis not present

## 2017-09-02 DIAGNOSIS — M62830 Muscle spasm of back: Secondary | ICD-10-CM | POA: Diagnosis not present

## 2017-09-06 DIAGNOSIS — M47816 Spondylosis without myelopathy or radiculopathy, lumbar region: Secondary | ICD-10-CM | POA: Diagnosis not present

## 2017-09-06 DIAGNOSIS — M5441 Lumbago with sciatica, right side: Secondary | ICD-10-CM | POA: Diagnosis not present

## 2017-09-06 DIAGNOSIS — M9903 Segmental and somatic dysfunction of lumbar region: Secondary | ICD-10-CM | POA: Diagnosis not present

## 2017-09-06 DIAGNOSIS — M62830 Muscle spasm of back: Secondary | ICD-10-CM | POA: Diagnosis not present

## 2017-09-07 DIAGNOSIS — M5441 Lumbago with sciatica, right side: Secondary | ICD-10-CM | POA: Diagnosis not present

## 2017-09-07 DIAGNOSIS — M62830 Muscle spasm of back: Secondary | ICD-10-CM | POA: Diagnosis not present

## 2017-09-07 DIAGNOSIS — M9903 Segmental and somatic dysfunction of lumbar region: Secondary | ICD-10-CM | POA: Diagnosis not present

## 2017-09-07 DIAGNOSIS — M47816 Spondylosis without myelopathy or radiculopathy, lumbar region: Secondary | ICD-10-CM | POA: Diagnosis not present

## 2017-09-09 DIAGNOSIS — M62830 Muscle spasm of back: Secondary | ICD-10-CM | POA: Diagnosis not present

## 2017-09-09 DIAGNOSIS — M5441 Lumbago with sciatica, right side: Secondary | ICD-10-CM | POA: Diagnosis not present

## 2017-09-09 DIAGNOSIS — M9903 Segmental and somatic dysfunction of lumbar region: Secondary | ICD-10-CM | POA: Diagnosis not present

## 2017-09-09 DIAGNOSIS — M47816 Spondylosis without myelopathy or radiculopathy, lumbar region: Secondary | ICD-10-CM | POA: Diagnosis not present

## 2017-09-13 DIAGNOSIS — M9903 Segmental and somatic dysfunction of lumbar region: Secondary | ICD-10-CM | POA: Diagnosis not present

## 2017-09-13 DIAGNOSIS — M47816 Spondylosis without myelopathy or radiculopathy, lumbar region: Secondary | ICD-10-CM | POA: Diagnosis not present

## 2017-09-13 DIAGNOSIS — M62830 Muscle spasm of back: Secondary | ICD-10-CM | POA: Diagnosis not present

## 2017-09-13 DIAGNOSIS — M5441 Lumbago with sciatica, right side: Secondary | ICD-10-CM | POA: Diagnosis not present

## 2017-09-15 DIAGNOSIS — M62830 Muscle spasm of back: Secondary | ICD-10-CM | POA: Diagnosis not present

## 2017-09-15 DIAGNOSIS — M47816 Spondylosis without myelopathy or radiculopathy, lumbar region: Secondary | ICD-10-CM | POA: Diagnosis not present

## 2017-09-15 DIAGNOSIS — M5441 Lumbago with sciatica, right side: Secondary | ICD-10-CM | POA: Diagnosis not present

## 2017-09-15 DIAGNOSIS — M9903 Segmental and somatic dysfunction of lumbar region: Secondary | ICD-10-CM | POA: Diagnosis not present

## 2017-09-16 DIAGNOSIS — M47816 Spondylosis without myelopathy or radiculopathy, lumbar region: Secondary | ICD-10-CM | POA: Diagnosis not present

## 2017-09-16 DIAGNOSIS — M5441 Lumbago with sciatica, right side: Secondary | ICD-10-CM | POA: Diagnosis not present

## 2017-09-16 DIAGNOSIS — M62830 Muscle spasm of back: Secondary | ICD-10-CM | POA: Diagnosis not present

## 2017-09-16 DIAGNOSIS — M9903 Segmental and somatic dysfunction of lumbar region: Secondary | ICD-10-CM | POA: Diagnosis not present

## 2017-09-22 DIAGNOSIS — M62830 Muscle spasm of back: Secondary | ICD-10-CM | POA: Diagnosis not present

## 2017-09-22 DIAGNOSIS — M47816 Spondylosis without myelopathy or radiculopathy, lumbar region: Secondary | ICD-10-CM | POA: Diagnosis not present

## 2017-09-22 DIAGNOSIS — M5441 Lumbago with sciatica, right side: Secondary | ICD-10-CM | POA: Diagnosis not present

## 2017-09-22 DIAGNOSIS — M9903 Segmental and somatic dysfunction of lumbar region: Secondary | ICD-10-CM | POA: Diagnosis not present

## 2017-09-28 DIAGNOSIS — M47816 Spondylosis without myelopathy or radiculopathy, lumbar region: Secondary | ICD-10-CM | POA: Diagnosis not present

## 2017-09-28 DIAGNOSIS — M5441 Lumbago with sciatica, right side: Secondary | ICD-10-CM | POA: Diagnosis not present

## 2017-09-28 DIAGNOSIS — M62830 Muscle spasm of back: Secondary | ICD-10-CM | POA: Diagnosis not present

## 2017-09-28 DIAGNOSIS — M9903 Segmental and somatic dysfunction of lumbar region: Secondary | ICD-10-CM | POA: Diagnosis not present

## 2017-10-04 DIAGNOSIS — M9903 Segmental and somatic dysfunction of lumbar region: Secondary | ICD-10-CM | POA: Diagnosis not present

## 2017-10-04 DIAGNOSIS — M62830 Muscle spasm of back: Secondary | ICD-10-CM | POA: Diagnosis not present

## 2017-10-04 DIAGNOSIS — M5441 Lumbago with sciatica, right side: Secondary | ICD-10-CM | POA: Diagnosis not present

## 2017-10-04 DIAGNOSIS — M47816 Spondylosis without myelopathy or radiculopathy, lumbar region: Secondary | ICD-10-CM | POA: Diagnosis not present

## 2017-10-07 DIAGNOSIS — M47816 Spondylosis without myelopathy or radiculopathy, lumbar region: Secondary | ICD-10-CM | POA: Diagnosis not present

## 2017-10-07 DIAGNOSIS — M62830 Muscle spasm of back: Secondary | ICD-10-CM | POA: Diagnosis not present

## 2017-10-07 DIAGNOSIS — M9903 Segmental and somatic dysfunction of lumbar region: Secondary | ICD-10-CM | POA: Diagnosis not present

## 2017-10-07 DIAGNOSIS — M5441 Lumbago with sciatica, right side: Secondary | ICD-10-CM | POA: Diagnosis not present

## 2017-10-14 DIAGNOSIS — M9903 Segmental and somatic dysfunction of lumbar region: Secondary | ICD-10-CM | POA: Diagnosis not present

## 2017-10-14 DIAGNOSIS — M47816 Spondylosis without myelopathy or radiculopathy, lumbar region: Secondary | ICD-10-CM | POA: Diagnosis not present

## 2017-10-14 DIAGNOSIS — M62830 Muscle spasm of back: Secondary | ICD-10-CM | POA: Diagnosis not present

## 2017-10-14 DIAGNOSIS — M5441 Lumbago with sciatica, right side: Secondary | ICD-10-CM | POA: Diagnosis not present

## 2017-12-31 ENCOUNTER — Ambulatory Visit (INDEPENDENT_AMBULATORY_CARE_PROVIDER_SITE_OTHER): Payer: Medicare Other | Admitting: Medical

## 2017-12-31 ENCOUNTER — Encounter: Payer: Self-pay | Admitting: Medical

## 2017-12-31 VITALS — BP 150/85 | HR 58 | Temp 97.8°F | Resp 16 | Ht 70.0 in | Wt 178.4 lb

## 2017-12-31 DIAGNOSIS — H6123 Impacted cerumen, bilateral: Secondary | ICD-10-CM

## 2017-12-31 DIAGNOSIS — R030 Elevated blood-pressure reading, without diagnosis of hypertension: Secondary | ICD-10-CM | POA: Diagnosis not present

## 2017-12-31 NOTE — Progress Notes (Signed)
Subjective:    [name], male    DOB: 07-Feb-1941, 77 y.o.   MRN: [id number]  HPI  Pt in for evaluation. He states he has history of cerumen impaction. He uses debrox on and off. He gets ears washed out about 3 times a year. He states known narrow canals.  Pt states he has had successful lavages with otc irrigation equipment.  No uri or allergy signs or symptoms. No ear.   Left side feels more blocked than the rt.   Pt tried to wash out wash himself but he could not.    Review of Systems  Constitutional: Negative for chills, fatigue and fever.  HENT: Negative for congestion, hearing loss, mouth sores, postnasal drip, sneezing, sore throat, tinnitus and trouble swallowing.        Ear wax both sides.  Respiratory: Negative for cough, chest tightness, shortness of breath and wheezing.   Cardiovascular: Negative for chest pain and palpitations.  Gastrointestinal: Negative for abdominal pain.  Musculoskeletal: Negative for back pain.  Skin: Negative for rash.  Neurological: Negative for dizziness, seizures, weakness, light-headedness and headaches.  Hematological: Negative for adenopathy. Does not bruise/bleed easily.  Psychiatric/Behavioral: Negative for behavioral problems, confusion, dysphoric mood, self-injury and suicidal ideas.    No past medical history on file.   Social History   Socioeconomic History  . Marital status: Married    Spouse name: Not on file  . Number of children: Not on file  . Years of education: Not on file  . Highest education level: Not on file  Occupational History  . Not on file  Social Needs  . Financial resource strain: Not on file  . Food insecurity:    Worry: Not on file    Inability: Not on file  . Transportation needs:    Medical: Not on file    Non-medical: Not on file  Tobacco Use  . Smoking status: Never Smoker  . Smokeless tobacco: Never Used  Substance and Sexual Activity  . Alcohol use: No  . Drug use: No    . Sexual activity: Never  Lifestyle  . Physical activity:    Days per week: Not on file    Minutes per session: Not on file  . Stress: Not on file  Relationships  . Social connections:    Talks on phone: Not on file    Gets together: Not on file    Attends religious service: Not on file    Active member of club or organization: Not on file    Attends meetings of clubs or organizations: Not on file    Relationship status: Not on file  . Intimate partner violence:    Fear of current or ex partner: Not on file    Emotionally abused: Not on file    Physically abused: Not on file    Forced sexual activity: Not on file  Other Topics Concern  . Not on file  Social History Narrative  . Not on file    Past Surgical History:  Procedure Laterality Date  . HERNIA REPAIR    . JOINT REPLACEMENT    . PROSTATE SURGERY    . TONSILLECTOMY      Family History  Problem Relation Age of Onset  . Parkinson's disease Father   . Parkinson's disease Brother     Allergies  Allergen Reactions  . Mupirocin Rash    Current Outpatient Medications on File Prior to Visit  Medication Sig Dispense Refill  .  aspirin 81 MG tablet Take 81 mg by mouth daily.    . Ranitidine HCl (ACID CONTROL PO) Take 50 mg by mouth daily. OTC Acutate      No current facility-administered medications on file prior to visit.     BP (!) 159/68   Pulse (!) 58   Temp 97.8 F (36.6 C) (Oral)   Resp 16   Ht 5\' 10"  (1.778 m)   Wt 178 lb 6.4 oz (80.9 kg)   SpO2 100%   BMI 25.60 kg/m       Objective:   Physical Exam  General  Mental Status - Alert. General Appearance - Well groomed. Not in acute distress.  Skin Rashes- No Rashes.  HEENT Head- Normal. Ear Auditory Canal - Left- very narrow  Right - very narrow.Tympanic Membrane- Left- can't see tm. Very deep wax adjacent to tm . Right- partial wax obstructin. Part of tm seen looks normal.(post lavage canals cleared) tms normal. Eye Sclera/Conjunctiva-  Left- Normal. Right- Normal. Nose & Sinuses Nasal Mucosa- Left-  Boggy and Congested. Right-  Boggy and  Congested.Bilateral maxillary and frontal sinus pressure.  Mouth & Throat Lips: Upper Lip- Normal: no dryness, cracking, pallor, cyanosis, or vesicular eruption. Lower Lip-Normal: no dryness, cracking, pallor, cyanosis or vesicular eruption. Buccal Mucosa- Bilateral- No Aphthous ulcers. Oropharynx- No Discharge or Erythema. Tonsils: Characteristics- Bilateral- No Erythema or Congestion. Size/Enlargement- Bilateral- No enlargement. Discharge- bilateral-None.  Neck Neck- Supple. No Masses.   Chest and Lung Exam Auscultation: Breath Sounds:-Clear even and unlabored.  Cardiovascular Auscultation:Rythm- Regular, rate and rhythm. Murmurs & Other Heart Sounds:Ausculatation of the heart reveal- No Murmurs.  Lymphatic Head & Neck General Head & Neck Lymphatics: Bilateral: Description- No Localized lymphadenopathy.       Assessment & Plan:  Your ear wax was cleared successfully today. You can continue to use otc debrox and try lavages at home for future wax obstruction.   If not able to clear wax we can lavage.  Follow up with pcp as regularly scheduled.  Encourage check bp at home to see if over 140/90 or return to see your pcp. Labs may be beneficial cmp and lipid panel as well.  Esperanza RichtersEdward Adaria Hole, PA-C

## 2017-12-31 NOTE — Patient Instructions (Addendum)
Your ear wax was cleared successfully today. You can continue to use otc debrox and try lavages at home for future wax obstruction.   If not able to clear wax we can lavage.  Follow up with pcp as regularly scheduled.  Encourage check bp at home to see if over 140/90 or return to see your pcp. Labs may be beneficial cmp and lipid panel as well.

## 2018-01-13 ENCOUNTER — Encounter: Payer: Medicare Other | Admitting: Medical

## 2018-06-02 NOTE — Progress Notes (Addendum)
Subjective:   Lewis Moccasinldon L Morones is a 78 y.o. male who presents for Medicare Annual/Subsequent preventive examination.  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  Review of Systems: No ROS.  Medicare Wellness Visit. Additional risk factors are reflected in the social history. Cardiac Risk Factors include: advanced age (>5055men, 38>65 women);male gender Sleep patterns:  No issues Home Safety/Smoke Alarms: Feels safe in home. Smoke alarms in place.  Lives with wife in 1 story home. Walk-in shower.  Dr.Lindsey at [facility] yearly  Male:   CCS-last reported 2012  PSA-  Lab Results  Component Value Date   PSA 1.95 11/12/2014   PSA 2.93 12/20/2012       Objective:    Vitals: BP 140/84 (BP Location: Right Arm, Cuff Size: Normal)   Pulse (!) 59   Ht 5\' 10"  (1.778 m)   Wt 182 lb 3.2 oz (82.6 kg)   SpO2 97%   BMI 26.14 kg/m   Body mass index is 26.14 kg/m.  Advanced Directives 06/03/2018  Does Patient Have a Medical Advance Directive? Yes  Type of Estate agentAdvance Directive Healthcare Power of [place] Gardens[place]orney;Living will  Copy of Healthcare Power of Attorney in Chart? No - copy requested    Tobacco Social History   Tobacco Use  Smoking Status Never Smoker  Smokeless Tobacco Never Used     Counseling given: Not Answered   Clinical Intake:     Pain : No/denies pain                 History reviewed. No pertinent past medical history. Past Surgical History:  Procedure Laterality Date  . HERNIA REPAIR    . JOINT REPLACEMENT    . PROSTATE SURGERY    . TONSILLECTOMY     Family History  Problem Relation Age of Onset  . Parkinson's disease Father   . Parkinson's disease Brother    Social History   Socioeconomic History  . Marital status: Married    Spouse name: Not on file  . Number of children: Not on file  . Years of education: Not on file  . Highest education level: Not on file  Occupational History  . Not on file    Social Needs  . Financial resource strain: Not on file  . Food insecurity:    Worry: Not on file    Inability: Not on file  . Transportation needs:    Medical: Not on file    Non-medical: Not on file  Tobacco Use  . Smoking status: Never Smoker  . Smokeless tobacco: Never Used  Substance and Sexual Activity  . Alcohol use: No  . Drug use: No  . Sexual activity: Yes  Lifestyle  . Physical activity:    Days per week: Not on file    Minutes per session: Not on file  . Stress: Not on file  Relationships  . Social connections:    Talks on phone: Not on file    Gets together: Not on file    Attends religious service: Not on file    Active member of club or organization: Not on file    Attends meetings of clubs or organizations: Not on file    Relationship status: Not on file  Other Topics Concern  . Not on file  Social History Narrative  . Not on file    Outpatient Encounter Medications as of 06/03/2018  Medication Sig  . aspirin 81 MG tablet  Take 81 mg by mouth daily.  . Ranitidine HCl (ACID CONTROL PO) Take 50 mg by mouth daily. OTC Acutate    No facility-administered encounter medications on file as of 06/03/2018.     Activities of Daily Living In your present state of health, do you have any difficulty performing the following activities: 06/03/2018  Hearing? N  Vision? N  Difficulty concentrating or making decisions? N  Walking or climbing stairs? N  Dressing or bathing? N  Doing errands, shopping? N  Preparing Food and eating ? N  Using the Toilet? N  In the past six months, have you accidently leaked urine? N  Do you have problems with loss of bowel control? N  Managing your Medications? N  Managing your Finances? N  Housekeeping or managing your Housekeeping? N  Some recent data might be hidden    Patient Care Team: Noel Journey as PCP - General (Physician Assistant)   Assessment:   This is a routine wellness examination for Bernardo. Physical  assessment deferred to PCP.  Exercise Activities and Dietary recommendations Current Exercise Habits: Home exercise routine, Type of exercise: walking, Time (Minutes): 30, Frequency (Times/Week): 4, Weekly Exercise (Minutes/Week): 120, Intensity: Mild, Exercise limited by: None identified Diet (meal preparation, eat out, water intake, caffeinated beverages, dairy products, fruits and vegetables): well balanced, on average, 3 meals per day      Goals   None     Fall Risk Fall Risk  06/03/2018 12/25/2015 12/25/2015 11/12/2014  Falls in the past year? 0 No No No     Depression Screen PHQ 2/9 Scores 06/03/2018 12/25/2015 11/12/2014  PHQ - 2 Score 0 0 0  PHQ- 9 Score - 0 -    Cognitive Function Ad8 score reviewed for issues:  Issues making decisions:no   Less interest in hobbies / activities:no  Repeats questions, stories (family complaining):no  Trouble using ordinary gadgets (microwave, computer, phone):no  Forgets the month or year: no  Mismanaging finances: no  Remembering appts:no  Daily problems with thinking and/or memory:no Ad8 score is=0         Immunization History  Administered Date(s) Administered  . Influenza, High Dose Seasonal PF 04/10/2015  . Influenza-Unspecified 02/01/2013, 03/21/2014, 02/10/2016  . Pneumococcal Conjugate-13 11/12/2014  . Pneumococcal Polysaccharide-23 12/25/2015  . Tdap 10/26/2012  . Zoster 03/21/2013  . Zoster Recombinat (Shingrix) 09/04/2016   Screening Tests Health Maintenance  Topic Date Due  . INFLUENZA VACCINE  12/16/2017  . DTaP/Tdap/Td (2 - Td) 10/27/2022  . TETANUS/TDAP  10/27/2022  . PNA vac Low Risk Adult  Completed       Plan:    Please schedule your next medicare wellness visit with me in 1 yr.  Continue to eat heart healthy diet (full of fruits, vegetables, whole grains, lean protein, water--limit salt, fat, and sugar intake)   Bring a copy of your living will and/or healthcare power of attorney to your next  office visit.   I have personally reviewed and noted the following in the patient's chart:   . Medical and social history . Use of alcohol, tobacco or illicit drugs  . Current medications and supplements . Functional ability and status . Nutritional status . Physical activity . Advanced directives . List of other physicians . Hospitalizations, surgeries, and ER visits in previous 12 months . Vitals . Screenings to include cognitive, depression, and falls . Referrals and appointments  In addition, I have reviewed and discussed with patient certain preventive protocols, quality metrics, and  best practice recommendations. A written personalized care plan for preventive services as well as general preventive health recommendations were provided to patient.     [place]on [place], [place]  06/03/2018   Evaluated and agree with RN assessment & plan.  Esperanza Richters, PA-C

## 2018-06-03 ENCOUNTER — Encounter: Payer: Self-pay | Admitting: *Deleted

## 2018-06-03 ENCOUNTER — Ambulatory Visit (INDEPENDENT_AMBULATORY_CARE_PROVIDER_SITE_OTHER): Payer: Medicare Other | Admitting: *Deleted

## 2018-06-03 VITALS — BP 140/84 | HR 59 | Ht 70.0 in | Wt 182.2 lb

## 2018-06-03 DIAGNOSIS — Z0000 Encounter for general adult medical examination without abnormal findings: Secondary | ICD-10-CM

## 2018-06-03 NOTE — Patient Instructions (Signed)
Please schedule your next medicare wellness visit with me in 1 yr.  Continue to eat heart healthy diet (full of fruits, vegetables, whole grains, lean protein, water--limit salt, fat, and sugar intake)   Bring a copy of your living will and/or healthcare power of attorney to your next office visit.   [name] , Thank you for taking time to come for your Medicare Wellness Visit. I appreciate your ongoing commitment to your health goals. Please review the following plan we discussed and let me know if I can assist you in the future.   These are the goals we discussed: Goals    . DIET - EAT MORE FRUITS AND VEGETABLES    . DIET - REDUCE SODIUM INTAKE    . Maintain healthy active lifestyle       This is a list of the screening recommended for you and due dates:  Health Maintenance  Topic Date Due  . DTaP/Tdap/Td vaccine (2 - Td) 10/27/2022  . Tetanus Vaccine  10/27/2022  . Flu Shot  Completed  . Pneumonia vaccines  Completed    DASH Eating Plan DASH stands for "Dietary Approaches to Stop Hypertension." The DASH eating plan is a healthy eating plan that has been shown to reduce high blood pressure (hypertension). It may also reduce your risk for type 2 diabetes, heart disease, and stroke. The DASH eating plan may also help with weight loss. What are tips for following this plan?  General guidelines  Avoid eating more than 2,300 mg (milligrams) of salt (sodium) a day. If you have hypertension, you may need to reduce your sodium intake to 1,500 mg a day.  Limit alcohol intake to no more than 1 drink a day for nonpregnant women and 2 drinks a day for men. One drink equals 12 oz of beer, 5 oz of wine, or 1 oz of hard liquor.  Work with your health care provider to maintain a healthy body weight or to lose weight. Ask what an ideal weight is for you.  Get at least 30 minutes of exercise that causes your heart to beat faster (aerobic exercise) most days of the week. Activities may  include walking, swimming, or biking.  Work with your health care provider or diet and nutrition specialist (dietitian) to adjust your eating plan to your individual calorie needs. Reading food labels   Check food labels for the amount of sodium per serving. Choose foods with less than 5 percent of the Daily Value of sodium. Generally, foods with less than 300 mg of sodium per serving fit into this eating plan.  To find whole grains, look for the word "whole" as the first word in the ingredient list. Shopping  Buy products labeled as "low-sodium" or "no salt added."  Buy fresh foods. Avoid canned foods and premade or frozen meals. Cooking  Avoid adding salt when cooking. Use salt-free seasonings or herbs instead of table salt or sea salt. Check with your health care provider or pharmacist before using salt substitutes.  Do not fry foods. Cook foods using healthy methods such as baking, boiling, grilling, and broiling instead.  Cook with heart-healthy oils, such as olive, canola, soybean, or sunflower oil. Meal planning  Eat a balanced diet that includes: ? 5 or more servings of fruits and vegetables each day. At each meal, try to fill half of your plate with fruits and vegetables. ? Up to 6-8 servings of whole grains each day. ? Less than 6 oz of lean meat, poultry,  or fish each day. A 3-oz serving of meat is about the same size as a deck of cards. One egg equals 1 oz. ? 2 servings of low-fat dairy each day. ? A serving of nuts, seeds, or beans 5 times each week. ? Heart-healthy fats. Healthy fats called Omega-3 fatty acids are found in foods such as flaxseeds and coldwater fish, like sardines, salmon, and mackerel.  Limit how much you eat of the following: ? Canned or prepackaged foods. ? Food that is high in trans fat, such as fried foods. ? Food that is high in saturated fat, such as fatty meat. ? Sweets, desserts, sugary drinks, and other foods with added sugar. ? Full-fat  dairy products.  Do not salt foods before eating.  Try to eat at least 2 vegetarian meals each week.  Eat more home-cooked food and less restaurant, buffet, and fast food.  When eating at a restaurant, ask that your food be prepared with less salt or no salt, if possible. What foods are recommended? The items listed may not be a complete list. Talk with your dietitian about what dietary choices are best for you. Grains Whole-grain or whole-wheat bread. Whole-grain or whole-wheat pasta. Brown rice. Orpah Cobbatmeal. Quinoa. Bulgur. Whole-grain and low-sodium cereals. Pita bread. Low-fat, low-sodium crackers. Whole-wheat flour tortillas. Vegetables Fresh or frozen vegetables (raw, steamed, roasted, or grilled). Low-sodium or reduced-sodium tomato and vegetable juice. Low-sodium or reduced-sodium tomato sauce and tomato paste. Low-sodium or reduced-sodium canned vegetables. Fruits All fresh, dried, or frozen fruit. Canned fruit in natural juice (without added sugar). Meat and other protein foods Skinless chicken or Malawiturkey. Ground chicken or [place]urkey. Pork with fat trimmed off. Fish and seafood. Egg whites. Dried beans, peas, or lentils. Unsalted nuts, nut butters, and seeds. Unsalted canned beans. Lean cuts of beef with fat trimmed off. Low-sodium, lean deli meat. Dairy Low-fat (1%) or fat-free (skim) milk. Fat-free, low-fat, or reduced-fat cheeses. Nonfat, low-sodium ricotta or cottage cheese. Low-fat or nonfat yogurt. Low-fat, low-sodium cheese. Fats and oils Soft margarine without trans fats. Vegetable oil. Low-fat, reduced-fat, or light mayonnaise and salad dressings (reduced-sodium). Canola, safflower, olive, soybean, and sunflower oils. Avocado. Seasoning and other foods Herbs. Spices. Seasoning mixes without salt. Unsalted popcorn and pretzels. Fat-free sweets. What foods are not recommended? The items listed may not be a complete list. Talk with your dietitian about what dietary choices are best  for you. Grains Baked goods made with fat, such as croissants, muffins, or some breads. Dry pasta or rice meal packs. Vegetables Creamed or fried vegetables. Vegetables in a cheese sauce. Regular canned vegetables (not low-sodium or reduced-sodium). Regular canned tomato sauce and paste (not low-sodium or reduced-sodium). Regular tomato and vegetable juice (not low-sodium or reduced-sodium). Rosita FirePickles. Olives. Fruits Canned fruit in a light or heavy syrup. Fried fruit. Fruit in cream or butter sauce. Meat and other protein foods Fatty cuts of meat. Ribs. Fried meat. Tomasa BlaseBacon. Sausage. Bologna and other processed lunch meats. Salami. Fatback. Hotdogs. Bratwurst. Salted nuts and seeds. Canned beans with added salt. Canned or smoked fish. Whole eggs or egg yolks. Chicken or Mal[place]urkey with skin. Dairy Whole or 2% milk, cream, and half-and-half. Whole or full-fat cream cheese. Whole-fat or sweetened yogurt. Full-fat cheese. Nondairy creamers. Whipped toppings. Processed cheese and cheese spreads. Fats and oils Butter. Stick margarine. Lard. Shortening. Ghee. Bacon fat. Tropical oils, such as coconut, palm kernel, or palm oil. Seasoning and other foods Salted popcorn and pretzels. Onion salt, garlic salt, seasoned salt, table salt, and sea  salt. Worcestershire sauce. Tartar sauce. Barbecue sauce. Teriyaki sauce. Soy sauce, including reduced-sodium. Steak sauce. Canned and packaged gravies. Fish sauce. Oyster sauce. Cocktail sauce. Horseradish that you find on the shelf. Ketchup. Mustard. Meat flavorings and tenderizers. Bouillon cubes. Hot sauce and Tabasco sauce. Premade or packaged marinades. Premade or packaged taco seasonings. Relishes. Regular salad dressings. Where to find more information:  National Heart, Lung, and Blood Institute: PopSteam.is  American Heart Association: www.heart.org Summary  The DASH eating plan is a healthy eating plan that has been shown to reduce high blood pressure  (hypertension). It may also reduce your risk for type 2 diabetes, heart disease, and stroke.  With the DASH eating plan, you should limit salt (sodium) intake to 2,300 mg a day. If you have hypertension, you may need to reduce your sodium intake to 1,500 mg a day.  When on the DASH eating plan, aim to eat more fresh fruits and vegetables, whole grains, lean proteins, low-fat dairy, and heart-healthy fats.  Work with your health care provider or diet and nutrition specialist (dietitian) to adjust your eating plan to your individual calorie needs. This information is not intended to replace advice given to you by your health care provider. Make sure you discuss any questions you have with your health care provider. Document Released: 04/23/2011 Document Revised: 04/27/2016 Document Reviewed: 04/27/2016 Elsevier Interactive Patient Education  2019 [employer].

## 2018-11-28 DIAGNOSIS — M9903 Segmental and somatic dysfunction of lumbar region: Secondary | ICD-10-CM | POA: Diagnosis not present

## 2018-11-28 DIAGNOSIS — M62830 Muscle spasm of back: Secondary | ICD-10-CM | POA: Diagnosis not present

## 2018-11-28 DIAGNOSIS — M47816 Spondylosis without myelopathy or radiculopathy, lumbar region: Secondary | ICD-10-CM | POA: Diagnosis not present

## 2018-11-28 DIAGNOSIS — M5441 Lumbago with sciatica, right side: Secondary | ICD-10-CM | POA: Diagnosis not present

## 2018-11-29 DIAGNOSIS — M5441 Lumbago with sciatica, right side: Secondary | ICD-10-CM | POA: Diagnosis not present

## 2018-11-29 DIAGNOSIS — M62830 Muscle spasm of back: Secondary | ICD-10-CM | POA: Diagnosis not present

## 2018-11-29 DIAGNOSIS — M47816 Spondylosis without myelopathy or radiculopathy, lumbar region: Secondary | ICD-10-CM | POA: Diagnosis not present

## 2018-11-29 DIAGNOSIS — M9903 Segmental and somatic dysfunction of lumbar region: Secondary | ICD-10-CM | POA: Diagnosis not present

## 2018-12-07 DIAGNOSIS — M47816 Spondylosis without myelopathy or radiculopathy, lumbar region: Secondary | ICD-10-CM | POA: Diagnosis not present

## 2018-12-07 DIAGNOSIS — M9903 Segmental and somatic dysfunction of lumbar region: Secondary | ICD-10-CM | POA: Diagnosis not present

## 2018-12-07 DIAGNOSIS — M62830 Muscle spasm of back: Secondary | ICD-10-CM | POA: Diagnosis not present

## 2018-12-07 DIAGNOSIS — M5441 Lumbago with sciatica, right side: Secondary | ICD-10-CM | POA: Diagnosis not present

## 2018-12-08 DIAGNOSIS — M5441 Lumbago with sciatica, right side: Secondary | ICD-10-CM | POA: Diagnosis not present

## 2018-12-08 DIAGNOSIS — M62830 Muscle spasm of back: Secondary | ICD-10-CM | POA: Diagnosis not present

## 2018-12-08 DIAGNOSIS — M9903 Segmental and somatic dysfunction of lumbar region: Secondary | ICD-10-CM | POA: Diagnosis not present

## 2018-12-08 DIAGNOSIS — M47816 Spondylosis without myelopathy or radiculopathy, lumbar region: Secondary | ICD-10-CM | POA: Diagnosis not present

## 2018-12-15 DIAGNOSIS — M9903 Segmental and somatic dysfunction of lumbar region: Secondary | ICD-10-CM | POA: Diagnosis not present

## 2018-12-15 DIAGNOSIS — M62830 Muscle spasm of back: Secondary | ICD-10-CM | POA: Diagnosis not present

## 2018-12-15 DIAGNOSIS — M5441 Lumbago with sciatica, right side: Secondary | ICD-10-CM | POA: Diagnosis not present

## 2018-12-15 DIAGNOSIS — M47816 Spondylosis without myelopathy or radiculopathy, lumbar region: Secondary | ICD-10-CM | POA: Diagnosis not present

## 2018-12-19 DIAGNOSIS — M62830 Muscle spasm of back: Secondary | ICD-10-CM | POA: Diagnosis not present

## 2018-12-19 DIAGNOSIS — M47816 Spondylosis without myelopathy or radiculopathy, lumbar region: Secondary | ICD-10-CM | POA: Diagnosis not present

## 2018-12-19 DIAGNOSIS — M5441 Lumbago with sciatica, right side: Secondary | ICD-10-CM | POA: Diagnosis not present

## 2018-12-19 DIAGNOSIS — M9903 Segmental and somatic dysfunction of lumbar region: Secondary | ICD-10-CM | POA: Diagnosis not present

## 2018-12-22 DIAGNOSIS — M47816 Spondylosis without myelopathy or radiculopathy, lumbar region: Secondary | ICD-10-CM | POA: Diagnosis not present

## 2018-12-22 DIAGNOSIS — M5441 Lumbago with sciatica, right side: Secondary | ICD-10-CM | POA: Diagnosis not present

## 2018-12-22 DIAGNOSIS — M62830 Muscle spasm of back: Secondary | ICD-10-CM | POA: Diagnosis not present

## 2018-12-22 DIAGNOSIS — M9903 Segmental and somatic dysfunction of lumbar region: Secondary | ICD-10-CM | POA: Diagnosis not present

## 2018-12-26 DIAGNOSIS — M9903 Segmental and somatic dysfunction of lumbar region: Secondary | ICD-10-CM | POA: Diagnosis not present

## 2018-12-26 DIAGNOSIS — M47816 Spondylosis without myelopathy or radiculopathy, lumbar region: Secondary | ICD-10-CM | POA: Diagnosis not present

## 2018-12-26 DIAGNOSIS — M62830 Muscle spasm of back: Secondary | ICD-10-CM | POA: Diagnosis not present

## 2018-12-26 DIAGNOSIS — M5441 Lumbago with sciatica, right side: Secondary | ICD-10-CM | POA: Diagnosis not present

## 2018-12-29 DIAGNOSIS — M9903 Segmental and somatic dysfunction of lumbar region: Secondary | ICD-10-CM | POA: Diagnosis not present

## 2018-12-29 DIAGNOSIS — M62830 Muscle spasm of back: Secondary | ICD-10-CM | POA: Diagnosis not present

## 2018-12-29 DIAGNOSIS — M5441 Lumbago with sciatica, right side: Secondary | ICD-10-CM | POA: Diagnosis not present

## 2018-12-29 DIAGNOSIS — M47816 Spondylosis without myelopathy or radiculopathy, lumbar region: Secondary | ICD-10-CM | POA: Diagnosis not present

## 2019-01-02 DIAGNOSIS — M5441 Lumbago with sciatica, right side: Secondary | ICD-10-CM | POA: Diagnosis not present

## 2019-01-02 DIAGNOSIS — M9903 Segmental and somatic dysfunction of lumbar region: Secondary | ICD-10-CM | POA: Diagnosis not present

## 2019-01-02 DIAGNOSIS — M47816 Spondylosis without myelopathy or radiculopathy, lumbar region: Secondary | ICD-10-CM | POA: Diagnosis not present

## 2019-01-04 DIAGNOSIS — M5441 Lumbago with sciatica, right side: Secondary | ICD-10-CM | POA: Diagnosis not present

## 2019-01-04 DIAGNOSIS — M9903 Segmental and somatic dysfunction of lumbar region: Secondary | ICD-10-CM | POA: Diagnosis not present

## 2019-01-04 DIAGNOSIS — M47816 Spondylosis without myelopathy or radiculopathy, lumbar region: Secondary | ICD-10-CM | POA: Diagnosis not present

## 2019-01-09 DIAGNOSIS — M47816 Spondylosis without myelopathy or radiculopathy, lumbar region: Secondary | ICD-10-CM | POA: Diagnosis not present

## 2019-01-09 DIAGNOSIS — M5441 Lumbago with sciatica, right side: Secondary | ICD-10-CM | POA: Diagnosis not present

## 2019-01-09 DIAGNOSIS — M9903 Segmental and somatic dysfunction of lumbar region: Secondary | ICD-10-CM | POA: Diagnosis not present

## 2019-01-12 DIAGNOSIS — M47816 Spondylosis without myelopathy or radiculopathy, lumbar region: Secondary | ICD-10-CM | POA: Diagnosis not present

## 2019-01-12 DIAGNOSIS — M5441 Lumbago with sciatica, right side: Secondary | ICD-10-CM | POA: Diagnosis not present

## 2019-01-12 DIAGNOSIS — M9903 Segmental and somatic dysfunction of lumbar region: Secondary | ICD-10-CM | POA: Diagnosis not present

## 2019-01-25 DIAGNOSIS — M47816 Spondylosis without myelopathy or radiculopathy, lumbar region: Secondary | ICD-10-CM | POA: Diagnosis not present

## 2019-01-25 DIAGNOSIS — M5441 Lumbago with sciatica, right side: Secondary | ICD-10-CM | POA: Diagnosis not present

## 2019-01-25 DIAGNOSIS — M9903 Segmental and somatic dysfunction of lumbar region: Secondary | ICD-10-CM | POA: Diagnosis not present

## 2019-02-22 DIAGNOSIS — M9903 Segmental and somatic dysfunction of lumbar region: Secondary | ICD-10-CM | POA: Diagnosis not present

## 2019-02-22 DIAGNOSIS — M5441 Lumbago with sciatica, right side: Secondary | ICD-10-CM | POA: Diagnosis not present

## 2019-02-22 DIAGNOSIS — M47816 Spondylosis without myelopathy or radiculopathy, lumbar region: Secondary | ICD-10-CM | POA: Diagnosis not present

## 2019-03-22 DIAGNOSIS — M9903 Segmental and somatic dysfunction of lumbar region: Secondary | ICD-10-CM | POA: Diagnosis not present

## 2019-03-22 DIAGNOSIS — M5441 Lumbago with sciatica, right side: Secondary | ICD-10-CM | POA: Diagnosis not present

## 2019-03-22 DIAGNOSIS — M47816 Spondylosis without myelopathy or radiculopathy, lumbar region: Secondary | ICD-10-CM | POA: Diagnosis not present

## 2019-04-19 DIAGNOSIS — M47816 Spondylosis without myelopathy or radiculopathy, lumbar region: Secondary | ICD-10-CM | POA: Diagnosis not present

## 2019-04-19 DIAGNOSIS — M9903 Segmental and somatic dysfunction of lumbar region: Secondary | ICD-10-CM | POA: Diagnosis not present

## 2019-04-19 DIAGNOSIS — M5441 Lumbago with sciatica, right side: Secondary | ICD-10-CM | POA: Diagnosis not present

## 2019-04-30 ENCOUNTER — Emergency Department (HOSPITAL_BASED_OUTPATIENT_CLINIC_OR_DEPARTMENT_OTHER)
Admission: EM | Admit: 2019-04-30 | Discharge: 2019-04-30 | Disposition: A | Discharge status: Home / Self Care | Payer: Medicare Other | Attending: Emergency Medicine | Admitting: Emergency Medicine

## 2019-04-30 ENCOUNTER — Emergency Department (HOSPITAL_BASED_OUTPATIENT_CLINIC_OR_DEPARTMENT_OTHER): Payer: Medicare Other

## 2019-04-30 ENCOUNTER — Other Ambulatory Visit: Payer: Self-pay

## 2019-04-30 ENCOUNTER — Encounter (HOSPITAL_BASED_OUTPATIENT_CLINIC_OR_DEPARTMENT_OTHER): Payer: Self-pay | Admitting: Emergency Medicine

## 2019-04-30 DIAGNOSIS — R6883 Chills (without fever): Secondary | ICD-10-CM | POA: Diagnosis not present

## 2019-04-30 DIAGNOSIS — Z7982 Long term (current) use of aspirin: Secondary | ICD-10-CM | POA: Insufficient documentation

## 2019-04-30 DIAGNOSIS — R197 Diarrhea, unspecified: Secondary | ICD-10-CM | POA: Diagnosis not present

## 2019-04-30 DIAGNOSIS — U071 COVID-19: Secondary | ICD-10-CM

## 2019-04-30 DIAGNOSIS — Z79899 Other long term (current) drug therapy: Secondary | ICD-10-CM | POA: Insufficient documentation

## 2019-04-30 LAB — COMPREHENSIVE METABOLIC PANEL
ALT: 21 U/L (ref 0–44)
AST: 25 U/L (ref 15–41)
Albumin: 3.5 g/dL (ref 3.5–5.0)
Alkaline Phosphatase: 68 U/L (ref 38–126)
Anion gap: 8 (ref 5–15)
BUN: 14 mg/dL (ref 8–23)
CO2: 28 mmol/L (ref 22–32)
Calcium: 8.2 mg/dL — ABNORMAL LOW (ref 8.9–10.3)
Chloride: 100 mmol/L (ref 98–111)
Creatinine, Ser: 0.83 mg/dL (ref 0.61–1.24)
GFR calc Af Amer: >60 mL/min (ref >60)
GFR calc non Af Amer: >60 mL/min (ref >60)
Glucose, Bld: 103 mg/dL — ABNORMAL HIGH (ref 70–99)
Potassium: 3.9 mmol/L (ref 3.5–5.1)
Sodium: 136 mmol/L (ref 135–145)
Total Bilirubin: 1.1 mg/dL (ref 0.3–1.2)
Total Protein: 6.4 g/dL — ABNORMAL LOW (ref 6.5–8.1)

## 2019-04-30 LAB — CBC
HCT: 43.8 % (ref 39.0–52.0)
Hemoglobin: 15 g/dL (ref 13.0–17.0)
MCH: 32.1 pg (ref 26.0–34.0)
MCHC: 34.2 g/dL (ref 30.0–36.0)
MCV: 93.8 fL (ref 80.0–100.0)
Platelets: 141 10*3/uL — ABNORMAL LOW (ref 150–400)
RBC: 4.67 MIL/uL (ref 4.22–5.81)
RDW: 12.1 % (ref 11.5–15.5)
WBC: 4.5 10*3/uL (ref 4.0–10.5)
nRBC: 0 % (ref 0.0–0.2)

## 2019-04-30 LAB — LACTIC ACID, PLASMA: Lactic Acid, Venous: 1.3 mmol/L (ref 0.5–1.9)

## 2019-04-30 LAB — SARS CORONAVIRUS 2 AG (30 MIN TAT): SARS Coronavirus 2 Ag: POSITIVE — AB

## 2019-04-30 MED ORDER — ACETAMINOPHEN 500 MG PO TABS
1000.0000 mg | ORAL_TABLET | Freq: Once | ORAL | Status: AC
  Administered 2019-04-30: 1000 mg via ORAL
  Filled 2019-04-30: qty 2

## 2019-04-30 MED ORDER — SODIUM CHLORIDE 0.9 % IV BOLUS
500.0000 mL | Freq: Once | INTRAVENOUS | Status: AC
  Administered 2019-04-30: 500 mL via INTRAVENOUS

## 2019-04-30 MED ORDER — ONDANSETRON 4 MG PO TBDP: 4.0000 mg | ORAL_TABLET | Freq: Three times a day (TID) | ORAL | 0 refills | Status: DC | PRN

## 2019-04-30 NOTE — ED Provider Notes (Signed)
[facility] EMERGENCY DEPARTMENT Provider Note   CSN: [id number] Arrival date & time: 04/30/19  1406     History Chief Complaint  Patient presents with  . Chills  . Diarrhea    Roger Williams is a 78 y.o. male.  Patient c/o fever, chills, diarrhea, decreased appetite, impaired taste, for the past 3-4 days. Symptoms acute onset, moderate, constant, persistent, felt worse today. Denies headache. No neck pain or stiffness. No sore throat. Mild nasal congestion and drainage. No sinus pain. No cough or sob. No chest pain or discomfort. No abd pain. No vomiting. +decreased appetite and mild nausea. Decreased po intake for past few days. A few loose/diarrheal stools per day. No dysuria. No hx uti. Denies skin changes, lesions or rash. No known ill contacts or known covid+ exposure.   The history is provided by the patient.  Diarrhea Associated symptoms: chills and fever   Associated symptoms: no abdominal pain, no headaches and no vomiting        History reviewed. No pertinent past medical history.  Patient Active Problem List   Diagnosis Date Noted  . Gastroesophageal reflux disease without esophagitis 06/17/2015  . Allergic reaction 04/24/2015  . Rhus dermatitis 01/04/2015  . Screening for ischemic heart disease 11/12/2014  . Prostate cancer screening 11/12/2014  . Routine history and physical examination of adult 11/12/2014  . Need for prophylactic vaccination against Streptococcus pneumoniae (pneumococcus) 11/12/2014  . Encounter for preventive health examination 12/20/2012    Past Surgical History:  Procedure Laterality Date  . HERNIA REPAIR    . JOINT REPLACEMENT    . PROSTATE SURGERY    . TONSILLECTOMY         Family History  Problem Relation Age of Onset  . Parkinson's disease Father   . Parkinson's disease Brother     Social History   Tobacco Use  . Smoking status: Never Smoker  . Smokeless tobacco: Never Used  Substance Use Topics  . Alcohol  use: No  . Drug use: No    Home Medications Prior to Admission medications   Medication Sig Start Date End Date Taking? Authorizing Provider  aspirin 81 MG tablet Take 81 mg by mouth daily.    [provider]  Ranitidine HCl (ACID CONTROL PO) Take 50 mg by mouth daily. OTC Acutate     [provider]    Allergies    Patient has no known allergies.  Review of Systems   Review of Systems  Constitutional: Positive for chills and fever.  HENT: Positive for congestion. Negative for sore throat.   Eyes: Negative for redness.  Respiratory: Negative for cough and shortness of breath.   Cardiovascular: Negative for chest pain.  Gastrointestinal: Positive for diarrhea. Negative for abdominal pain and vomiting.  Genitourinary: Negative for dysuria and flank pain.  Musculoskeletal: Negative for back pain, neck pain and neck stiffness.  Skin: Negative for rash.  Neurological: Negative for headaches.  Hematological: Does not bruise/bleed easily.  Psychiatric/Behavioral: Negative for confusion.    Physical Exam Updated Vital Signs BP (!) 145/76 (BP Location: Left Arm)   Pulse 73   Temp 99.7 F (37.6 C) (Oral)   Resp 20   Ht 1.778 m (5\' 10" )   Wt 79.4 kg   SpO2 93%   BMI 25.11 kg/m   Physical Exam Vitals and nursing note reviewed.  Constitutional:      Appearance: Normal appearance. He is well-developed.  HENT:     Head: Atraumatic.  Nose: Nose normal.     Mouth/Throat:     Mouth: Mucous membranes are moist.     Pharynx: Oropharynx is clear.  Eyes:     General: No scleral icterus.    Conjunctiva/sclera: Conjunctivae normal.     Pupils: Pupils are equal, round, and reactive to light.  Neck:     Trachea: No tracheal deviation.     Comments: No stiffness or rigidity.  Cardiovascular:     Rate and Rhythm: Normal rate and regular rhythm.     Pulses: Normal pulses.     Heart sounds: Normal heart sounds. No murmur. No friction rub. No gallop.   Pulmonary:      Effort: Pulmonary effort is normal. No accessory muscle usage or respiratory distress.     Breath sounds: Normal breath sounds.  Abdominal:     General: Bowel sounds are normal. There is no distension.     Palpations: Abdomen is soft.     Tenderness: There is no abdominal tenderness. There is no guarding.  Genitourinary:    Comments: No cva tenderness. Musculoskeletal:        General: No swelling or tenderness.     Cervical back: Normal range of motion and neck supple. No rigidity.  Lymphadenopathy:     Cervical: No cervical adenopathy.  Skin:    General: Skin is warm and dry.     Findings: No rash.  Neurological:     Mental Status: He is alert.     Comments: Alert, speech clear.   Psychiatric:        Mood and Affect: Mood normal.     ED Results / Procedures / Treatments   Labs (all labs ordered are listed, but only abnormal results are displayed) Results for orders placed or performed during the hospital encounter of 04/30/19  Lactic acid  Result Value Ref Range   Lactic Acid, Venous 1.3 0.5 - 1.9 mmol/L  CMET  Result Value Ref Range   Sodium 136 135 - 145 mmol/L   Potassium 3.9 3.5 - 5.1 mmol/L   Chloride 100 98 - 111 mmol/L   CO2 28 22 - 32 mmol/L   Glucose, Bld 103 (H) 70 - 99 mg/dL   BUN 14 8 - 23 mg/dL   Creatinine, Ser 0.83 0.61 - 1.24 mg/dL   Calcium 8.2 (L) 8.9 - 10.3 mg/dL   Total Protein 6.4 (L) 6.5 - 8.1 g/dL   Albumin 3.5 3.5 - 5.0 g/dL   AST 25 15 - 41 U/L   ALT 21 0 - 44 U/L   Alkaline Phosphatase 68 38 - 126 U/L   Total Bilirubin 1.1 0.3 - 1.2 mg/dL   GFR calc non Af Amer >60 >60 mL/min   GFR calc Af Amer >60 >60 mL/min   Anion gap 8 5 - 15    EKG None  Radiology XR Chest Portable  Result Date: 04/30/2019 CLINICAL DATA:  Chills and loss of appetite EXAM: PORTABLE CHEST 1 VIEW COMPARISON:  12/20/2012 FINDINGS: Cardiac shadow is within normal limits. The lungs are well aerated bilaterally. No focal infiltrate or sizable effusion is seen.  No bony abnormality is noted. IMPRESSION: No active disease. Electronically Signed   By: Inez Catalina M.D.   On: 04/30/2019 15:27    Procedures Procedures (including critical care time)  Medications Ordered in ED Medications - No data to display  ED Course  I have reviewed the triage vital signs and the nursing notes.  Pertinent labs & imaging results that  were available during my care of the patient were reviewed by me and considered in my medical decision making (see chart for details).    MDM Rules/Calculators/A&P  Iv ns bolus. Labs sent. Pcxr.   Reviewed nursing notes and prior charts for additional history.   Covid swab sent.   Roger Williams was evaluated in Emergency Department on 04/30/2019 for the symptoms described in the history of present illness. He was evaluated in the context of the global COVID-19 pandemic, which necessitated consideration that the patient might be at risk for infection with the SARS-CoV-2 virus that causes COVID-19. Institutional protocols and algorithms that pertain to the evaluation of patients at risk for COVID-19 are in a state of rapid change based on information released by regulatory bodies including the CDC and federal and state organizations. These policies and algorithms were followed during the patient's care in the ED.  Labs reviewed/interpreted by me - lactate is normal. Chem normal. Additional labs and ua pending.   Xray reviewed/interpreted by me - no pna.   Signed out to Dr Stevie Kern to check ua, and pending labs, recheck pt, and disposition appropriately.     Final Clinical Impression(s) / ED Diagnoses Final diagnoses:  None    Rx / DC Orders ED Discharge Orders    None       Cathren Laine, MD 04/30/19 1549

## 2019-04-30 NOTE — ED Triage Notes (Signed)
Pt c/o chills, diarrhea loss of appetite x 6 days.  Pt lives at home with spouse that had similar symptoms.

## 2019-04-30 NOTE — ED Notes (Signed)
Pt on monitor 

## 2019-04-30 NOTE — Discharge Instructions (Signed)
Recommend taking the prescribed Zofran as needed for nausea.  Recommend scheduling follow-up appointment for a virtual recheck with your primary doctor early next week.  If you develop any difficulty breathing, vomiting, chest pain or other new concerning symptom, recommend return to ER for reassessment.  Recommend following isolation precautions discussed.

## 2019-05-01 ENCOUNTER — Telehealth: Payer: Self-pay | Admitting: Medical

## 2019-05-01 ENCOUNTER — Other Ambulatory Visit: Payer: Self-pay | Admitting: Unknown Physician Specialty

## 2019-05-01 ENCOUNTER — Telehealth: Payer: Self-pay | Admitting: Unknown Physician Specialty

## 2019-05-01 DIAGNOSIS — U071 COVID-19: Secondary | ICD-10-CM

## 2019-05-01 NOTE — Telephone Encounter (Signed)
  I connected by phone with Roger Williams on 05/01/2019 at 4:12 PM to discuss the potential use of an new treatment for mild to moderate COVID-19 viral infection in non-hospitalized patients.  This patient is a 78 y.o. male that meets the FDA criteria for Emergency Use Authorization of bamlanivimab or casirivimab\imdevimab.  Has a (+) direct SARS-CoV-2 viral test result  Has mild or moderate COVID-19   Is ? 78 years of age and weighs ? 40 kg  Is NOT hospitalized due to COVID-19  Is NOT requiring oxygen therapy or requiring an increase in baseline oxygen flow rate due to COVID-19  Is within 10 days of symptom onset  Has at least one of the high risk factor(s) for progression to severe COVID-19 and/or hospitalization as defined in EUA.  Specific high risk criteria : >/= 79 yo   I have spoken and communicated the following to the patient or parent/caregiver:  1. FDA has authorized the emergency use of bamlanivimab and casirivimab\imdevimab for the treatment of mild to moderate COVID-19 in adults and pediatric patients with positive results of direct SARS-CoV-2 viral testing who are 67 years of age and older weighing at least 40 kg, and who are at high risk for progressing to severe COVID-19 and/or hospitalization.  2. The significant known and potential risks and benefits of bamlanivimab and casirivimab\imdevimab, and the extent to which such potential risks and benefits are unknown.  3. Information on available alternative treatments and the risks and benefits of those alternatives, including clinical trials.  4. Patients treated with bamlanivimab and casirivimab\imdevimab should continue to self-isolate and use infection control measures (e.g., wear mask, isolate, social distance, avoid sharing personal items, clean and disinfect "high touch" surfaces, and frequent handwashing) according to CDC guidelines.   5. The patient or parent/caregiver has the option to accept or refuse  bamlanivimab or casirivimab\imdevimab .  After reviewing this information with the patient, He wants to talk to his son. Roger Williams 05/01/2019 4:12 PM

## 2019-05-01 NOTE — Telephone Encounter (Signed)
Left message on machine to call back  [place] for [place] to discuss and to make follow up virtual visit.

## 2019-05-01 NOTE — Telephone Encounter (Signed)
Called to discuss with patient about Covid symptoms and the use of bamlanivimab, a monoclonal antibody infusion for those with mild to moderate Covid symptoms and at a high risk of hospitalization.  Pt is qualified for this infusion at the [facility] due to Age > 65   Message left to call back  

## 2019-05-01 NOTE — Telephone Encounter (Signed)
I saw that patient was seen in ED recently. His covid test came back positive so wanted to know how he was doing. Wanted to offer virtual visit later this week for follow up. Does he have 02 sat monitor to check sats. Any sob, wheezing, or productive cough?

## 2019-05-02 NOTE — Telephone Encounter (Signed)
Patient stated that he is doing fine, he had a little GI upset and no appetite, and just felt terrible.  He does not have any SOB, wheezing, or productive cough.  VV made for Thursday.

## 2019-05-04 ENCOUNTER — Ambulatory Visit (HOSPITAL_COMMUNITY)
Admission: RE | Admit: 2019-05-04 | Discharge: 2019-05-04 | Disposition: A | Discharge status: Home / Self Care | Payer: Medicare Other | Source: Ambulatory Visit | Attending: Pulmonary Disease | Admitting: Pulmonary Disease

## 2019-05-04 ENCOUNTER — Encounter (HOSPITAL_COMMUNITY): Payer: Self-pay

## 2019-05-04 DIAGNOSIS — U071 COVID-19: Secondary | ICD-10-CM | POA: Diagnosis not present

## 2019-05-04 DIAGNOSIS — Z23 Encounter for immunization: Secondary | ICD-10-CM | POA: Diagnosis not present

## 2019-05-04 MED ORDER — DIPHENHYDRAMINE HCL 50 MG/ML IJ SOLN: 50.0000 mg | Freq: Once | INTRAMUSCULAR | Status: DC | PRN

## 2019-05-04 MED ORDER — ALBUTEROL SULFATE HFA 108 (90 BASE) MCG/ACT IN AERS: 2.0000 | INHALATION_SPRAY | Freq: Once | RESPIRATORY_TRACT | Status: DC | PRN

## 2019-05-04 MED ORDER — EPINEPHRINE 0.3 MG/0.3ML IJ SOAJ: 0.3000 mg | Freq: Once | INTRAMUSCULAR | Status: DC | PRN

## 2019-05-04 MED ORDER — SODIUM CHLORIDE 0.9 % IV SOLN
Freq: Once | INTRAVENOUS | Status: AC
  Filled 2019-05-04: qty 10

## 2019-05-04 MED ORDER — SODIUM CHLORIDE 0.9 % IV SOLN: INTRAVENOUS | Status: DC | PRN

## 2019-05-04 MED ORDER — FAMOTIDINE IN NACL 20-0.9 MG/50ML-% IV SOLN: 20.0000 mg | Freq: Once | INTRAVENOUS | Status: DC | PRN

## 2019-05-04 MED ORDER — METHYLPREDNISOLONE SODIUM SUCC 125 MG IJ SOLR: 125.0000 mg | Freq: Once | INTRAMUSCULAR | Status: DC | PRN

## 2019-05-04 NOTE — Progress Notes (Signed)
  Diagnosis: COVID-19  Physician:  Procedure: [facility] Infusion Clinic Med: casirivimab\imdevimab infusion - Provided patient with casirivimab\imdevimab fact sheet for patients, parents and caregivers prior to infusion.  Complications: No immediate complications noted.  Discharge: Discharged home   Roger Williams 05/04/2019

## 2019-05-04 NOTE — Discharge Instructions (Signed)
COVID-19 COVID-19 is a respiratory infection that is caused by a virus called severe acute respiratory syndrome coronavirus 2 (SARS-CoV-2). The disease is also known as coronavirus disease or novel coronavirus. In some people, the virus may not cause any symptoms. In others, it may cause a serious infection. The infection can get worse quickly and can lead to complications, such as:  Pneumonia, or infection of the lungs.  Acute respiratory distress syndrome or ARDS. This is fluid build-up in the lungs.  Acute respiratory failure. This is a condition in which there is not enough oxygen passing from the lungs to the body.  Sepsis or septic shock. This is a serious bodily reaction to an infection.  Blood clotting problems.  Secondary infections due to bacteria or fungus. The virus that causes COVID-19 is contagious. This means that it can spread from person to person through droplets from coughs and sneezes (respiratory secretions). What are the causes? This illness is caused by a virus. You may catch the virus by:  Breathing in droplets from an infected person's cough or sneeze.  Touching something, like a table or a doorknob, that was exposed to the virus (contaminated) and then touching your mouth, nose, or eyes. What increases the risk? Risk for infection You are more likely to be infected with this virus if you:  Live in or travel to an area with a COVID-19 outbreak.  Come in contact with a sick person who recently traveled to an area with a COVID-19 outbreak.  Provide care for or live with a person who is infected with COVID-19. Risk for serious illness You are more likely to become seriously ill from the virus if you:  Are 65 years of age or older.  Have a long-term disease that lowers your body's ability to fight infection (immunocompromised).  Live in a nursing home or long-term care facility.  Have a long-term (chronic) disease such as: ? Chronic lung disease, including  chronic obstructive pulmonary disease or asthma ? Heart disease. ? Diabetes. ? Chronic kidney disease. ? Liver disease.  Are obese. What are the signs or symptoms? Symptoms of this condition can range from mild to severe. Symptoms may appear any time from 2 to 14 days after being exposed to the virus. They include:  A fever.  A cough.  Difficulty breathing.  Chills.  Muscle pains.  A sore throat.  Loss of taste or smell. Some people may also have stomach problems, such as nausea, vomiting, or diarrhea. Other people may not have any symptoms of COVID-19. How is this diagnosed? This condition may be diagnosed based on:  Your signs and symptoms, especially if: ? You live in an area with a COVID-19 outbreak. ? You recently traveled to or from an area where the virus is common. ? You provide care for or live with a person who was diagnosed with COVID-19.  A physical exam.  Lab tests, which may include: ? A nasal swab to take a sample of fluid from your nose. ? A throat swab to take a sample of fluid from your throat. ? A sample of mucus from your lungs (sputum). ? Blood tests.  Imaging tests, which may include, X-rays, CT scan, or ultrasound. How is this treated? At present, there is no medicine to treat COVID-19. Medicines that treat other diseases are being used on a trial basis to see if they are effective against COVID-19. Your health care provider will talk with you about ways to treat your symptoms. For most   people, the infection is mild and can be managed at home with rest, fluids, and over-the-counter medicines. Treatment for a serious infection usually takes places in a hospital intensive care unit (ICU). It may include one or more of the following treatments. These treatments are given until your symptoms improve.  Receiving fluids and medicines through an IV.  Supplemental oxygen. Extra oxygen is given through a tube in the nose, a face mask, or a  hood.  Positioning you to lie on your stomach (prone position). This makes it easier for oxygen to get into the lungs.  Continuous positive airway pressure (CPAP) or bi-level positive airway pressure (BPAP) machine. This treatment uses mild air pressure to keep the airways open. A tube that is connected to a motor delivers oxygen to the body.  Ventilator. This treatment moves air into and out of the lungs by using a tube that is placed in your windpipe.  Tracheostomy. This is a procedure to create a hole in the neck so that a breathing tube can be inserted.  Extracorporeal membrane oxygenation (ECMO). This procedure gives the lungs a chance to recover by taking over the functions of the heart and lungs. It supplies oxygen to the body and removes carbon dioxide. Follow these instructions at home: Lifestyle  If you are sick, stay home except to get medical care. Your health care provider will tell you how long to stay home. Call your health care provider before you go for medical care.  Rest at home as told by your health care provider.  Do not use any products that contain nicotine or tobacco, such as cigarettes, e-cigarettes, and chewing tobacco. If you need help quitting, ask your health care provider.  Return to your normal activities as told by your health care provider. Ask your health care provider what activities are safe for you. General instructions  Take over-the-counter and prescription medicines only as told by your health care provider.  Drink enough fluid to keep your urine pale yellow.  Keep all follow-up visits as told by your health care provider. This is important. How is this prevented?  There is no vaccine to help prevent COVID-19 infection. However, there are steps you can take to protect yourself and others from this virus. To protect yourself:   Do not travel to areas where COVID-19 is a risk. The areas where COVID-19 is reported change often. To identify  high-risk areas and travel restrictions, check the CDC travel website: wwwnc.cdc.gov/travel/notices  If you live in, or must travel to, an area where COVID-19 is a risk, take precautions to avoid infection. ? Stay away from people who are sick. ? Wash your hands often with soap and water for 20 seconds. If soap and water are not available, use an alcohol-based hand sanitizer. ? Avoid touching your mouth, face, eyes, or nose. ? Avoid going out in public, follow guidance from your state and local health authorities. ? If you must go out in public, wear a cloth face covering or face mask. ? Disinfect objects and surfaces that are frequently touched every day. This may include:  Counters and tables.  Doorknobs and light switches.  Sinks and faucets.  Electronics, such as phones, remote controls, keyboards, computers, and tablets. To protect others: If you have symptoms of COVID-19, take steps to prevent the virus from spreading to others.  If you think you have a COVID-19 infection, contact your health care provider right away. Tell your health care team that you think you may   have a COVID-19 infection.  Stay home. Leave your house only to seek medical care. Do not use public transport.  Do not travel while you are sick.  Wash your hands often with soap and water for 20 seconds. If soap and water are not available, use alcohol-based hand sanitizer.  Stay away from other members of your household. Let healthy household members care for children and pets, if possible. If you have to care for children or pets, wash your hands often and wear a mask. If possible, stay in your own room, separate from others. Use a different bathroom.  Make sure that all people in your household wash their hands well and often.  Cough or sneeze into a tissue or your sleeve or elbow. Do not cough or sneeze into your hand or into the air.  Wear a cloth face covering or face mask. Where to find more  information  Centers for Disease Control and Prevention: www.cdc.gov/coronavirus/2019-ncov/index.html  World Health Organization: www.who.int/health-topics/coronavirus Contact a health care provider if:  You live in or have traveled to an area where COVID-19 is a risk and you have symptoms of the infection.  You have had contact with someone who has COVID-19 and you have symptoms of the infection. Get help right away if:  You have trouble breathing.  You have pain or pressure in your chest.  You have confusion.  You have bluish lips and fingernails.  You have difficulty waking from sleep.  You have symptoms that get worse. These symptoms may represent a serious problem that is an emergency. Do not wait to see if the symptoms will go away. Get medical help right away. Call your local emergency services (911 in the [place] Do not drive yourself to the hospital. Let the emergency medical personnel know if you think you have COVID-19. Summary  COVID-19 is a respiratory infection that is caused by a virus. It is also known as coronavirus disease or novel coronavirus. It can cause serious infections, such as pneumonia, acute respiratory distress syndrome, acute respiratory failure, or sepsis.  The virus that causes COVID-19 is contagious. This means that it can spread from person to person through droplets from coughs and sneezes.  You are more likely to develop a serious illness if you are 65 years of age or older, have a weak immunity, live in a nursing home, or have chronic disease.  There is no medicine to treat COVID-19. Your health care provider will talk with you about ways to treat your symptoms.  Take steps to protect yourself and others from infection. Wash your hands often and disinfect objects and surfaces that are frequently touched every day. Stay away from people who are sick and wear a mask if you are sick. This information is not intended to replace advice given to you by  your health care provider. Make sure you discuss any questions you have with your health care provider. Document Released: 06/09/2018 Document Revised: 09/29/2018 Document Reviewed: 06/09/2018 Elsevier Patient Education  2020 [employer]  Prevent the Spread of COVID-19 if You Are Sick If you are sick with COVID-19 or think you might have COVID-19, follow the steps below to help protect other people in your home and community. Stay home except to get medical care.  Stay home. Most people with COVID-19 have mild illness and are able to recover at home without medical care. Do not leave your home, except to get medical care. Do not visit public areas.  Take care of yourself.   Get rest and stay hydrated.  Get medical care when needed. Call your doctor before you go to their office for care. But, if you have trouble breathing or other concerning symptoms, call 911 for immediate help.  Avoid public transportation, ride-sharing, or taxis. Separate yourself from other people and pets in your home.  As much as possible, stay in a specific room and away from other people and pets in your home. Also, you should use a separate bathroom, if available. If you need to be around other people or animals in or outside of the home, wear a cloth face covering. ? See COVID-19 and Animals if you have questions about pets: https://www.cdc.gov/coronavirus/2019-ncov/faq.html#COVID19animals Monitor your symptoms.  Common symptoms of COVID-19 include fever and cough. Trouble breathing is a more serious symptom that means you should get medical attention.  Follow care instructions from your healthcare provider and local health department. Your local health authorities will give instructions on checking your symptoms and reporting information. If you develop emergency warning signs for COVID-19 get medical attention immediately.  Emergency warning signs include*:  Trouble breathing  Persistent pain or pressure in  the chest  New confusion or not able to be woken  Bluish lips or face *This list is not all inclusive. Please consult your medical provider for any other symptoms that are severe or concerning to you. Call 911 if you have a medical emergency. If you have a medical emergency and need to call 911, notify the operator that you have or think you might have, COVID-19. If possible, put on a facemask before medical help arrives. Call ahead before visiting your doctor.  Call ahead. Many medical visits for routine care are being postponed or done by phone or telemedicine.  If you have a medical appointment that cannot be postponed, call your doctor's office. This will help the office protect themselves and other patients. If you are sick, wear a cloth covering over your nose and mouth.  You should wear a cloth face covering over your nose and mouth if you must be around other people or animals, including pets (even at home).  You don't need to wear the cloth face covering if you are alone. If you can't put on a cloth face covering (because of trouble breathing for example), cover your coughs and sneezes in some other way. Try to stay at least 6 feet away from other people. This will help protect the people around you. Note: During the COVID-19 pandemic, medical grade facemasks are reserved for healthcare workers and some first responders. You may need to make a cloth face covering using a scarf or bandana. Cover your coughs and sneezes.  Cover your mouth and nose with a tissue when you cough or sneeze.  Throw used tissues in a lined trash can.  Immediately wash your hands with soap and water for at least 20 seconds. If soap and water are not available, clean your hands with an alcohol-based hand sanitizer that contains at least 60% alcohol. Clean your hands often.  Wash your hands often with soap and water for at least 20 seconds. This is especially important after blowing your nose, coughing, or  sneezing; going to the bathroom; and before eating or preparing food.  Use hand sanitizer if soap and water are not available. Use an alcohol-based hand sanitizer with at least 60% alcohol, covering all surfaces of your hands and rubbing them together until they feel dry.  Soap and water are the best option, especially if your   hands are visibly dirty.  Avoid touching your eyes, nose, and mouth with unwashed hands. Avoid sharing personal household items.  Do not share dishes, drinking glasses, cups, eating utensils, towels, or bedding with other people in your home.  Wash these items thoroughly after using them with soap and water or put them in the dishwasher. Clean all "high-touch" surfaces everyday.  Clean and disinfect high-touch surfaces in your "sick room" and bathroom. Let someone else clean and disinfect surfaces in common areas, but not your bedroom and bathroom.  If a caregiver or other person needs to clean and disinfect a sick person's bedroom or bathroom, they should do so on an as-needed basis. The caregiver/other person should wear a mask and wait as long as possible after the sick person has used the bathroom. High-touch surfaces include phones, remote controls, counters, tabletops, doorknobs, bathroom fixtures, toilets, keyboards, tablets, and bedside tables.  Clean and disinfect areas that may have blood, stool, or body fluids on them.  Use household cleaners and disinfectants. Clean the area or item with soap and water or another detergent if it is dirty. Then use a household disinfectant. ? Be sure to follow the instructions on the label to ensure safe and effective use of the product. Many products recommend keeping the surface wet for several minutes to ensure germs are killed. Many also recommend precautions such as wearing gloves and making sure you have good ventilation during use of the product. ? Most EPA-registered household disinfectants should be effective. How to  discontinue home isolation  People with COVID-19 who have stayed home (home isolated) can stop home isolation under the following conditions: ? If you will not have a test to determine if you are still contagious, you can leave home after these three things have happened:  You have had no fever for at least 72 hours (that is three full days of no fever without the use of medicine that reduces fevers) AND  other symptoms have improved (for example, when your cough or shortness of breath has improved) AND  at least 10 days have passed since your symptoms first appeared. ? If you will be tested to determine if you are still contagious, you can leave home after these three things have happened:  You no longer have a fever (without the use of medicine that reduces fevers) AND  other symptoms have improved (for example, when your cough or shortness of breath has improved) AND  you received two negative tests in a row, 24 hours apart. Your doctor will follow CDC guidelines. In all cases, follow the guidance of your healthcare provider and local health department. The decision to stop home isolation should be made in consultation with your healthcare provider and state and local health departments. Local decisions depend on local circumstances. cdc.gov/coronavirus 09/18/2018 This information is not intended to replace advice given to you by your health care provider. Make sure you discuss any questions you have with your health care provider. Document Released: 08/30/2018 Document Revised: 09/28/2018 Document Reviewed: 08/30/2018 Elsevier Patient Education  2020 [employer] Inc.  

## 2019-05-05 ENCOUNTER — Other Ambulatory Visit: Payer: Self-pay

## 2019-05-05 ENCOUNTER — Ambulatory Visit (INDEPENDENT_AMBULATORY_CARE_PROVIDER_SITE_OTHER): Payer: Medicare Other | Admitting: Medical

## 2019-05-05 VITALS — BP 137/65 | Temp 98.0°F | Wt 172.0 lb

## 2019-05-05 DIAGNOSIS — U071 COVID-19: Secondary | ICD-10-CM | POA: Diagnosis not present

## 2019-05-05 NOTE — Patient Instructions (Signed)
Recent covid dx with improvement of symptoms except still fatigued. Counseled pt that fatigue can be common with covid and recovery time can vary. But still advised caution and explained watch for worsening signs/symptoms such as cough, fever, shortness of breath, and wheezing. Explained benefit of getting 02 sat monitor. Pt expressed understanding.   If worsens then consider antibiotic but presently sounds like not indicated.  Asked to give [place] update by phone or my chart early next week.  Explained for severe worsening signs/symptoms or low o2 sats then ED evaluation.

## 2019-05-05 NOTE — Progress Notes (Signed)
   Subjective:    [name], male    DOB: [birth year], 78 y.o.   MRN: [id number]  HPI Virtual Visit via Telephone Note  I connected with [place] on 05/05/19 at  8:00 AM EST by telephone and verified that I am speaking with the correct person using two identifiers.  Location: Patient: home  Provider: office   I discussed the limitations, risks, security and privacy concerns of performing an evaluation and management service by telephone and the availability of in person appointments. I also discussed with the patient that there may be a patient responsible charge related to this service. The patient expressed understanding and agreed to proceed.   History of Present Illness:   Pt states mostly feels tired now. Pt has no diarrhea. No cough, no fever, no chills, no sweats, no bo body aches and can smell. No sob or wheezing.  Prior symptoms in ED.   "Patient c/o fever, chills, diarrhea, decreased appetite, impaired taste, for the past 3-4 days. Symptoms acute onset, moderate, constant, persistent, felt worse today. Denies headache. No neck pain or stiffness. No sore throat. Mild nasal congestion and drainage. No sinus pain. No cough or sob. No chest pain or discomfort. No abd pain. No vomiting. +decreased appetite and mild nausea. Decreased po intake for past few days. A few loose/diarrheal stools per day. No dysuria. No hx uti. Denies skin changes, lesions or rash. No known ill contacts or known covid+ exposure."     Observations/Objective:  General- no acute distress, pleasant, alert, oriented and normal speech.  Assessment and Plan: Recent covid dx with improvement of symptoms except still fatigued. Counseled pt that fatigue can be common with covid and recovery time can vary. But still advised caution and explained watch for worsening signs/symptoms such as cough, fever, shortness of breath, and wheezing. Explained benefit of getting 02 sat monitor. Pt expressed  understanding.   If worsens then consider antibiotic but presently sounds like not indicated.  Asked to give [place] update by phone or my chart early next week.  Explained for severe worsening signs/symptoms or low o2 sats then ED evaluation.  Follow Up Instructions:    I discussed the assessment and treatment plan with the patient. The patient was provided an opportunity to ask questions and all were answered. The patient agreed with the plan and demonstrated an understanding of the instructions.   The patient was advised to call back or seek an in-person evaluation if the symptoms worsen or if the condition fails to improve as anticipated.  I provided 25 minutes of non-face-to-face time during this encounter.   Mackie Pai, PA-C    Review of Systems  Constitutional: Positive for fatigue. Negative for chills and fever.  HENT: Negative for congestion, facial swelling, mouth sores and postnasal drip.   Respiratory: Negative for cough, chest tightness, shortness of breath and wheezing.   Cardiovascular: Negative for chest pain and palpitations.  Gastrointestinal: Negative for abdominal pain.  Musculoskeletal: Negative for back pain and neck pain.  Skin: Negative for rash.  Neurological: Negative for dizziness, seizures, weakness and headaches.  Hematological: Negative for adenopathy. Does not bruise/bleed easily.  Psychiatric/Behavioral: Negative for behavioral problems, confusion and suicidal ideas.       Objective:   Physical Exam        Assessment & Plan:

## 2019-06-08 ENCOUNTER — Ambulatory Visit: Payer: Medicare Other | Admitting: *Deleted

## 2019-07-03 ENCOUNTER — Other Ambulatory Visit: Payer: Self-pay

## 2019-07-04 ENCOUNTER — Ambulatory Visit (HOSPITAL_BASED_OUTPATIENT_CLINIC_OR_DEPARTMENT_OTHER)
Admission: RE | Admit: 2019-07-04 | Discharge: 2019-07-04 | Disposition: A | Discharge status: Home / Self Care | Payer: Medicare Other | Source: Ambulatory Visit | Attending: Medical | Admitting: Medical

## 2019-07-04 ENCOUNTER — Other Ambulatory Visit: Payer: Self-pay

## 2019-07-04 ENCOUNTER — Ambulatory Visit (INDEPENDENT_AMBULATORY_CARE_PROVIDER_SITE_OTHER): Payer: Medicare Other | Admitting: Medical

## 2019-07-04 VITALS — BP 129/56 | HR 62 | Temp 96.1°F | Resp 12 | Ht 70.0 in | Wt 183.0 lb

## 2019-07-04 DIAGNOSIS — M545 Low back pain: Secondary | ICD-10-CM | POA: Diagnosis not present

## 2019-07-04 DIAGNOSIS — G8929 Other chronic pain: Secondary | ICD-10-CM

## 2019-07-04 DIAGNOSIS — M47816 Spondylosis without myelopathy or radiculopathy, lumbar region: Secondary | ICD-10-CM | POA: Diagnosis not present

## 2019-07-04 MED ORDER — DICLOFENAC SODIUM 75 MG PO TBEC: 75.0000 mg | DELAYED_RELEASE_TABLET | Freq: Two times a day (BID) | ORAL | 0 refills | Status: DC

## 2019-07-04 MED ORDER — KETOROLAC TROMETHAMINE 60 MG/2ML IM SOLN
60.0000 mg | Freq: Once | INTRAMUSCULAR | Status: AC
  Administered 2019-07-04: 60 mg via INTRAMUSCULAR

## 2019-07-04 MED ORDER — CYCLOBENZAPRINE HCL 5 MG PO TABS: 5.0000 mg | ORAL_TABLET | Freq: Every day | ORAL | 0 refills | Status: DC

## 2019-07-04 NOTE — Patient Instructions (Addendum)
You have  months of low back pain we gave you toradol 60 mg im. Then tomorrow morning can start diclofenac 75 twice daily. Also muscle relaxant flexeril 5 mg at night.  Can try back exercises as tolerated.  Give me update in a couple of day. If pain not improving significantly then refer to sports MD and PT.  Follow up date to be determined pending clinical response to tx and xray.   Back Exercises These exercises help to make your trunk and back strong. They also help to keep the lower back flexible. Doing these exercises can help to prevent back pain or lessen existing pain.  If you have back pain, try to do these exercises 2-3 times each day or as told by your doctor.  As you get better, do the exercises once each day. Repeat the exercises more often as told by your doctor.  To stop back pain from coming back, do the exercises once each day, or as told by your doctor. Exercises Single knee to chest Do these steps 3-5 times in a row for each leg: 1. Lie on your back on a firm bed or the floor with your legs stretched out. 2. Bring one knee to your chest. 3. Grab your knee or thigh with both hands and hold them it in place. 4. Pull on your knee until you feel a gentle stretch in your lower back or buttocks. 5. Keep doing the stretch for 10-30 seconds. 6. Slowly let go of your leg and straighten it. Pelvic tilt Do these steps 5-10 times in a row: 1. Lie on your back on a firm bed or the floor with your legs stretched out. 2. Bend your knees so they point up to the ceiling. Your feet should be flat on the floor. 3. Tighten your lower belly (abdomen) muscles to press your lower back against the floor. This will make your tailbone point up to the ceiling instead of pointing down to your feet or the floor. 4. Stay in this position for 5-10 seconds while you gently tighten your muscles and breathe evenly. Cat-cow Do these steps until your lower back bends more easily: 1. Get on your  hands and knees on a firm surface. Keep your hands under your shoulders, and keep your knees under your hips. You may put padding under your knees. 2. Let your head hang down toward your chest. Tighten (contract) the muscles in your belly. Point your tailbone toward the floor so your lower back becomes rounded like the back of a cat. 3. Stay in this position for 5 seconds. 4. Slowly lift your head. Let the muscles of your belly relax. Point your tailbone up toward the ceiling so your back forms a sagging arch like the back of a cow. 5. Stay in this position for 5 seconds.  Press-ups Do these steps 5-10 times in a row: 1. Lie on your belly (face-down) on the floor. 2. Place your hands near your head, about shoulder-width apart. 3. While you keep your back relaxed and keep your hips on the floor, slowly straighten your arms to raise the top half of your body and lift your shoulders. Do not use your back muscles. You may change where you place your hands in order to make yourself more comfortable. 4. Stay in this position for 5 seconds. 5. Slowly return to lying flat on the floor.  Bridges Do these steps 10 times in a row: 1. Lie on your back on a firm  surface. 2. Bend your knees so they point up to the ceiling. Your feet should be flat on the floor. Your arms should be flat at your sides, next to your body. 3. Tighten your butt muscles and lift your butt off the floor until your waist is almost as high as your knees. If you do not feel the muscles working in your butt and the back of your thighs, slide your feet 1-2 inches farther away from your butt. 4. Stay in this position for 3-5 seconds. 5. Slowly lower your butt to the floor, and let your butt muscles relax. If this exercise is too easy, try doing it with your arms crossed over your chest. Belly crunches Do these steps 5-10 times in a row: 1. Lie on your back on a firm bed or the floor with your legs stretched out. 2. Bend your knees so  they point up to the ceiling. Your feet should be flat on the floor. 3. Cross your arms over your chest. 4. Tip your chin a little bit toward your chest but do not bend your neck. 5. Tighten your belly muscles and slowly raise your chest just enough to lift your shoulder blades a tiny bit off of the floor. Avoid raising your body higher than that, because it can put too much stress on your low back. 6. Slowly lower your chest and your head to the floor. Back lifts Do these steps 5-10 times in a row: 1. Lie on your belly (face-down) with your arms at your sides, and rest your forehead on the floor. 2. Tighten the muscles in your legs and your butt. 3. Slowly lift your chest off of the floor while you keep your hips on the floor. Keep the back of your head in line with the curve in your back. Look at the floor while you do this. 4. Stay in this position for 3-5 seconds. 5. Slowly lower your chest and your face to the floor. Contact a doctor if:  Your back pain gets a lot worse when you do an exercise.  Your back pain does not get better 2 hours after you exercise. If you have any of these problems, stop doing the exercises. Do not do them again unless your doctor says it is okay. Get help right away if:  You have sudden, very bad back pain. If this happens, stop doing the exercises. Do not do them again unless your doctor says it is okay. This information is not intended to replace advice given to you by your health care provider. Make sure you discuss any questions you have with your health care provider. Document Revised: 01/27/2018 Document Reviewed: 01/27/2018 Elsevier Patient Education  2020 [employer].

## 2019-07-04 NOTE — Progress Notes (Addendum)
Subjective:    [name], male    DOB: 1941/03/03, 79 y.o.   MRN: [id number]  HPI    Pt in with some back pain. Pt states lower back pain for 2 months. States pain is rt side. Pt did go to chiropracter about 10-12 times and did not help. Pt not taking anything for the pain.No pain radiating to legs.Pt states no pain at rest. When he gets up from seated position and when bends over pain increases.   Bending down and then raising up causes pain.  Pt has not taken any meds.     Review of Systems  Constitutional: Negative for chills, fatigue and fever.  Respiratory: Negative for cough, choking, shortness of breath and wheezing.   Cardiovascular: Negative for chest pain and palpitations.  Gastrointestinal: Negative for abdominal pain.  Musculoskeletal: Positive for back pain.  Skin: Negative for rash.  Neurological: Negative for dizziness, seizures, syncope, weakness and headaches.  Hematological: Negative for adenopathy. Does not bruise/bleed easily.  Psychiatric/Behavioral: Negative for behavioral problems and decreased concentration.    No past medical history on file.   Social History   Socioeconomic History  . Marital status: Married    Spouse name: Not on file  . Number of children: Not on file  . Years of education: Not on file  . Highest education level: Not on file  Occupational History  . Not on file  Tobacco Use  . Smoking status: Never Smoker  . Smokeless tobacco: Never Used  Substance and Sexual Activity  . Alcohol use: No  . Drug use: No  . Sexual activity: Yes  Other Topics Concern  . Not on file  Social History Narrative  . Not on file   Social Determinants of Health   Financial Resource Strain:   . Difficulty of Paying Living Expenses: Not on file  Food Insecurity:   . Worried About Charity fundraiser in the Last Year: Not on file  . Ran Out of Food in the Last Year: Not on file  Transportation Needs:   . Lack of  Transportation (Medical): Not on file  . Lack of Transportation (Non-Medical): Not on file  Physical Activity:   . Days of Exercise per Week: Not on file  . Minutes of Exercise per Session: Not on file  Stress:   . Feeling of Stress : Not on file  Social Connections:   . Frequency of Communication with Friends and Family: Not on file  . Frequency of Social Gatherings with Friends and Family: Not on file  . Attends Religious Services: Not on file  . Active Member of Clubs or Organizations: Not on file  . Attends Archivist Meetings: Not on file  . Marital Status: Not on file  Intimate Partner Violence:   . Fear of Current or Ex-Partner: Not on file  . Emotionally Abused: Not on file  . Physically Abused: Not on file  . Sexually Abused: Not on file    Past Surgical History:  Procedure Laterality Date  . HERNIA REPAIR    . JOINT REPLACEMENT    . PROSTATE SURGERY    . TONSILLECTOMY      Family History  Problem Relation Age of Onset  . Parkinson's disease Father   . Parkinson's disease Brother     No Known Allergies  Current Outpatient Medications on File Prior to Visit  Medication Sig Dispense Refill  . aspirin 81 MG tablet Take 81 mg by mouth  daily.    . Multiple Vitamin (MULTIVITAMIN) tablet Take 1 tablet by mouth daily.    . Ranitidine HCl (ACID CONTROL PO) Take 50 mg by mouth daily. OTC Acutate      No current facility-administered medications on file prior to visit.    BP (!) 129/56 (BP Location: Right Arm, Cuff Size: Large)   Pulse 62   Temp (!) 96.1 F (35.6 C) (Temporal)   Resp 12   Ht 5\' 10"  (1.778 m)   Wt 183 lb (83 kg)   SpO2 95%   BMI 26.26 kg/m       Objective:   Physical Exam  General Appearance- Not in acute distress.    Chest and Lung Exam Auscultation: Breath sounds:-Normal. Clear even and unlabored. Adventitious sounds:- No Adventitious sounds.  Cardiovascular Auscultation:Rythm - Regular, rate and rythm. Heart Sounds  -Normal heart sounds.  Abdomen Inspection:-Inspection Normal.  Palpation/Perucssion: Palpation and Percussion of the abdomen reveal- Non Tender, No Rebound tenderness, No rigidity(Guarding) and No Palpable abdominal masses.  Liver:-Normal.  Spleen:- Normal.   Back Rt side lower  lumbar spine tenderness to palpation. Pain on straight leg lift. Pain on lateral movements and flexion/extension of the spine.  Lower ext neurologic  L5-S1 sensation intact bilaterally. Normal patellar reflexes bilaterally. No foot drop bilaterally.      Assessment & Plan:  You have months of low back pain we gave you toradol 60 mg im. Then tomorrow morning can start diclofenac 75 twice daily. Also muscle relaxant flexeril 5 mg at night.  Can try back exercises as tolerated.  Give me update in a couple of day. If pain not improving significantly then refer to sports MD and PT.  Follow up date to be determined pending clinical response to tx and xray.  25 minutes spent with pt.  , PA-C

## 2019-07-04 NOTE — Addendum Note (Signed)
Addended by: Gwenevere Abbot on: 07/04/2019 08:29 AM   Modules accepted: Orders

## 2019-07-04 NOTE — Addendum Note (Signed)
Addended by: Thelma Barge D on: 07/04/2019 08:39 AM   Modules accepted: Orders

## 2019-07-16 ENCOUNTER — Ambulatory Visit: Payer: Medicare Other | Attending: Internal Medicine

## 2019-07-16 DIAGNOSIS — Z23 Encounter for immunization: Secondary | ICD-10-CM

## 2019-07-16 NOTE — Progress Notes (Signed)
   Covid-19 Vaccination Clinic  Name:  Roger Williams    MRN: [id number] DOB: 1940/12/10  07/16/2019  Roger Williams was observed post Covid-19 immunization for 15 minutes without incidence. He was provided with Vaccine Information Sheet and instruction to access the V-Safe system.   Roger Williams was instructed to call 911 with any severe reactions post vaccine: Marland Kitchen Difficulty breathing  . Swelling of your face and throat  . A fast heartbeat  . A bad rash all over your body  . Dizziness and weakness    Immunizations Administered    Name Date Dose VIS Date Route   Pfizer COVID-19 Vaccine 07/16/2019 12:51 PM 0.3 mL 04/28/2019 Intramuscular   Manufacturer: [employer], [employer]   Lot: [id number]   NDC: [address]-7

## 2019-07-17 ENCOUNTER — Telehealth: Payer: Self-pay

## 2019-07-17 ENCOUNTER — Telehealth: Payer: Self-pay | Admitting: Medical

## 2019-07-17 DIAGNOSIS — G8929 Other chronic pain: Secondary | ICD-10-CM

## 2019-07-17 NOTE — Telephone Encounter (Signed)
Called patient to let him know a referral has been placed

## 2019-07-17 NOTE — Telephone Encounter (Signed)
Patient is requesting a referral to physical therapy, patient states that his back hasn't gotten any better since seeing Ramon Dredge

## 2019-07-18 NOTE — Telephone Encounter (Signed)
error 

## 2019-07-26 ENCOUNTER — Encounter: Payer: Self-pay | Admitting: Physical Therapy

## 2019-07-26 ENCOUNTER — Ambulatory Visit: Payer: Medicare Other | Attending: Medical | Admitting: Physical Therapy

## 2019-07-26 ENCOUNTER — Other Ambulatory Visit: Payer: Self-pay

## 2019-07-26 DIAGNOSIS — R29898 Other symptoms and signs involving the musculoskeletal system: Secondary | ICD-10-CM | POA: Diagnosis not present

## 2019-07-26 DIAGNOSIS — R293 Abnormal posture: Secondary | ICD-10-CM | POA: Diagnosis not present

## 2019-07-26 DIAGNOSIS — G8929 Other chronic pain: Secondary | ICD-10-CM

## 2019-07-26 DIAGNOSIS — M545 Low back pain: Secondary | ICD-10-CM | POA: Diagnosis not present

## 2019-07-26 NOTE — Therapy (Signed)
[facility] [address]  [address] [place], [place], [address] Phone: [phone]   Fax:  [phone]  Physical Therapy Evaluation  Patient Details  Name: Roger Williams MRN: [id number] Date of Birth: [birth year] Referring Provider (PT): Esperanza Richters, [place]   Encounter Date: 07/26/2019  PT End of Session - 07/26/19 0840    Visit Number  1    Number of Visits  7    Date for PT Re-Evaluation  09/06/19    Authorization Type  [employer]HC Medicare    PT Start Time  0758    PT Stop Time  0835    PT Time Calculation (min)  37 min    Activity Tolerance  Patient tolerated treatment well    Behavior During Therapy  [facility] for tasks assessed/performed       History reviewed. No pertinent past medical history.  Past Surgical History:  Procedure Laterality Date  . HERNIA REPAIR    . JOINT REPLACEMENT    . PROSTATE SURGERY    . TONSILLECTOMY      There were no vitals filed for this visit.   Subjective Assessment - 07/26/19 0801    Subjective  Patient reports LBP for the past 6 months. Denies inciting trauma or changes in activities. [place] to a [place] in [place], without relief. Pain is located above the R posterior hip. Denies radiation, N/T, or B&B changes. Has a sharp pain when walking or turning quickly, getting into bed especially when sidelying on L, leaning forward to hit a low ball when playing pickle ball, or when coming back up from leaning forward for an extended period. No better with ice/heat. Would like to return to playing golf.    Pertinent History  B total hip replacement, prostate surgery    Limitations  House hold activities;Walking;Lifting    How long can you sit comfortably?  unlimited    How long can you stand comfortably?  2 hours; unsure if limited by fatigue or pain    How long can you walk comfortably?  2-3 miles    Diagnostic tests  07/04/19 lumbar spine xray: Multilevel facet osteoarthritis and listhesis.  Lower lumbar disc degeneration. No acute finding.    Patient Stated Goals  get rid of pain    Currently in Pain?  Yes    Pain Location  Back    Pain Orientation  Right;Lower    Pain Descriptors / Indicators  Sharp    Pain Type  Chronic pain         [facility] PT Assessment - 07/26/19 0808      Assessment   Medical Diagnosis  Chronic R sided LBP without sciatica     Referring Provider (PT)  Esperanza Richters, PA-C    Onset Date/Surgical Date  01/26/19    Next MD Visit  not scheduled    Prior Therapy  no      Precautions   Precautions  None      Balance Screen   Has the patient fallen in the past 6 months  No    Has the patient had a decrease in activity level because of a fear of falling?   No    Is the patient reluctant to leave their home because of a fear of falling?   No      Home Environment   Living Environment  Private residence    Living Arrangements  Spouse/significant other    Available Help at Discharge  Family  Type of Home  House   townhouse   Home Access  Level entry    [place] - single point;Walker - standard      Prior Function   Level of Independence  Independent    Vocation  Retired    Veterinary surgeon work intermittently    Leisure  golf      Cognition   Overall Cognitive Status  Within [employer] for tasks assessed      Sensation   Light Touch  Appears Intact      Coordination   Gross Motor Movements are Fluid and Coordinated  Yes      Posture/Postural Control   Posture/Postural Control  Postural limitations    Postural Limitations  Rounded Shoulders;Forward head;Increased thoracic kyphosis      ROM / Strength   AROM / PROM / Strength  AROM;Strength      AROM   AROM Assessment Site  Lumbar    Lumbar Flexion  mid shin    Lumbar Extension  moderately limited    Lumbar - Right Side Bend  distal thigh    Lumbar - Left Side Bend  distal thigh    Lumbar - Right Rotation  WFL     Lumbar - Left Rotation  mildly limited      Strength   Strength Assessment Site  Hip;Knee;Ankle    Right/Left Hip  Right;Left    Right Hip Flexion  4+/5    Right Hip ABduction  4/5    Right Hip ADduction  4+/5    Left Hip Flexion  4+/5    Left Hip ABduction  4/5    Left Hip ADduction  4+/5    Right/Left Knee  Right;Left    Right Knee Flexion  4/5    Right Knee Extension  4+/5    Left Knee Flexion  4/5    Left Knee Extension  4+/5    Right/Left Ankle  Right;Left    Right Ankle Dorsiflexion  4+/5    Right Ankle Plantar Flexion  4+/5    Left Ankle Dorsiflexion  4+/5    Left Ankle Plantar Flexion  4+/5      Flexibility   Soft Tissue Assessment /Muscle Length  yes    Hamstrings  severely tight R, moderately tight L    Quadriceps  severely tight R, moderately tight L in mod thomas position     Piriformis  severely tight in fig 4 B LEs      Palpation   Palpation comment  no TTP; increased tone in R>L lumbar paraspinals and glutes      Ambulation/Gait   Assistive device  None    Gait Pattern  Step-through pattern;Decreased weight shift to right;Trunk flexed    Ambulation Surface  Level;Indoor    Gait velocity  WFL                Objective measurements completed on examination: See above findings.              PT Education - 07/26/19 0840    Education Details  prognosis, POC, HEP    Methods  Explanation;Demonstration;Tactile cues;Verbal cues;Handout    Comprehension  Verbalized understanding;Returned demonstration       PT Short Term Goals - 07/26/19 0849      PT SHORT TERM GOAL #1   Title  Patient to be independent with initial HEP.    Time  3  Period  Weeks    Status  New    Target Date  08/16/19        PT Long Term Goals - 07/26/19 0849      PT LONG TERM GOAL #1   Title  Patient to be independent with advanced HEP.    Time  6    Period  Weeks    Status  New    Target Date  09/06/19      PT LONG TERM GOAL #2   Title  Patient to  demonstrate B LE strength >/=4+/5.    Time  6    Period  Weeks    Status  New    Target Date  09/06/19      PT LONG TERM GOAL #3   Title  Patient to demonstrate B HS, hip flexor/quad, and piriformis flexibility with moderate tightness on R, mild tightness on L remaining.    Time  6    Period  Weeks    Status  New    Target Date  09/06/19      PT LONG TERM GOAL #4   Title  Patient to report 0/10 pain when lifting 15lbs box from floor with good lifting mechanics.    Time  6    Period  Weeks    Status  New    Target Date  09/06/19      PT LONG TERM GOAL #5   Title  Patient to report 60% improvement in LBP.    Time  6    Period  Weeks    Status  New    Target Date  09/06/19             Plan - 07/26/19 0846    Clinical Impression Statement  Patient is a 78y/o M presenting to OPPT with c/o insidious R sided LBP for the past 6 months. Pain is located above the R posterior hip. Denies radiation, N/T, or B&B changes. Aggravating factors include walking or turning quickly, getting into bed onto L side, leaning forward to hit a low ball during pickleball or coming up from leaning forward for an extended period of time. Patient today presenting with limited lumbar extension and L rotation, decreased B abductor and HS strength, considerably decreased flexibility in B HS, hip flexors/quads, and piriformis, and gait and postural deviations. Unable to reproduce patient's pain during today's assessment- plan to assess B hips next session. Patient educated on gentle stretching and strengthening HEP- patient reported understanding. Would benefit from skilled PT services 1x/week for 6 weeks to address aforementioned impairments.    Personal Factors and Comorbidities  Age;Sex;Comorbidity 2;Fitness;Past/Current Experience;Time since onset of injury/illness/exacerbation    Comorbidities  B total hip replacement, prostate surgery    Examination-Activity Limitations  Bed  Mobility;Bend;Squat;Stand;Dressing;Transfers;Hygiene/Grooming;Lift;Locomotion Level    Examination-Participation Restrictions  Church;Cleaning;Shop;Community Activity;Interpersonal Relationship;Laundry;Meal Prep    Stability/Clinical Decision Making  Stable/Uncomplicated    Clinical Decision Making  Low    Rehab Potential  Good    PT Frequency  1x / week    PT Duration  6 weeks    PT Treatment/Interventions  ADLs/Self Care Home Management;Cryotherapy;Electrical Stimulation;Moist Heat;Balance training;Therapeutic exercise;Therapeutic activities;Functional mobility training;Stair training;Gait training;Ultrasound;Neuromuscular re-education;Patient/family education;Manual techniques;Taping;Energy conservation;Dry needling;Passive range of motion    PT Next Visit Plan  reassess HEP; assess B hip AROM & hip rotation MMT    Consulted and Agree with Plan of Care  Patient       Patient will benefit from skilled therapeutic intervention in  order to improve the following deficits and impairments:  Decreased activity tolerance, Decreased strength, Pain, Difficulty walking, Increased muscle spasms, Improper body mechanics, Decreased range of motion, Impaired flexibility, Postural dysfunction  Visit Diagnosis: Chronic right-sided low back pain without sciatica  Other symptoms and signs involving the musculoskeletal system  Abnormal posture     Problem List Patient Active Problem List   Diagnosis Date Noted  . Gastroesophageal reflux disease without esophagitis 06/17/2015  . Allergic reaction 04/24/2015  . Rhus dermatitis 01/04/2015  . Screening for ischemic heart disease 11/12/2014  . Prostate cancer screening 11/12/2014  . Routine history and physical examination of adult 11/12/2014  . Need for prophylactic vaccination against Streptococcus pneumoniae (pneumococcus) 11/12/2014  . Encounter for preventive health examination 12/20/2012     Anette Guarneri, PT, DPT 07/26/19 8:54  AM   [address] [facility] [address]  [address] [place], [place], [address] Phone: [phone]   Fax:  [phone]  Name: YOUNIS MATHEY MRN: [id number] Date of Birth: Apr 08, 1941

## 2019-08-02 ENCOUNTER — Ambulatory Visit: Payer: Medicare Other

## 2019-08-03 ENCOUNTER — Other Ambulatory Visit: Payer: Self-pay

## 2019-08-03 ENCOUNTER — Ambulatory Visit: Payer: Medicare Other | Admitting: Physical Therapy

## 2019-08-03 ENCOUNTER — Encounter: Payer: Self-pay | Admitting: Physical Therapy

## 2019-08-03 DIAGNOSIS — R29898 Other symptoms and signs involving the musculoskeletal system: Secondary | ICD-10-CM | POA: Diagnosis not present

## 2019-08-03 DIAGNOSIS — M545 Low back pain, unspecified: Secondary | ICD-10-CM

## 2019-08-03 DIAGNOSIS — G8929 Other chronic pain: Secondary | ICD-10-CM | POA: Diagnosis not present

## 2019-08-03 DIAGNOSIS — R293 Abnormal posture: Secondary | ICD-10-CM | POA: Diagnosis not present

## 2019-08-03 NOTE — Therapy (Signed)
[facility] [address]  [address] [place], [place], [address] Phone: [phone]   Fax:  [phone]  Physical Therapy Treatment  Patient Details  Name: Roger Williams MRN: [id number] Date of Birth: [birth year] Referring Provider (PT): Esperanza Richters, PA-C    Encounter Date: 08/03/2019  PT End of Session - 08/03/19 1100    Visit Number  2    Number of Visits  7    Date for PT Re-Evaluation  09/06/19    Authorization Type  [employer] Medicare    PT Start Time  1011    PT Stop Time  1056    PT Time Calculation (min)  45 min    Activity Tolerance  Patient tolerated treatment well;Patient limited by pain    Behavior During Therapy  [facility] for tasks assessed/performed       History reviewed. No pertinent past medical history.  Past Surgical History:  Procedure Laterality Date  . HERNIA REPAIR    . JOINT REPLACEMENT    . PROSTATE SURGERY    . TONSILLECTOMY      There were no vitals filed for this visit.  Subjective Assessment - 08/03/19 1012    Subjective  Has not been well since last session. Attributes some of it to helping his brother out with some handywork/sawing last Friday. Has been unable to do HEP for the past couple of days d/t sharp pain, but was able to walk a mild this AM.    Pertinent History  B total hip replacement, prostate surgery    Diagnostic tests  07/04/19 lumbar spine xray: Multilevel facet osteoarthritis and listhesis. Lower lumbar disc degeneration. No acute finding.    Patient Stated Goals  get rid of pain    Currently in Pain?  No/denies         [facility] PT Assessment - 08/03/19 0001      AROM   AROM Assessment Site  Hip    Right/Left Hip  Right;Left    Right Hip Flexion  111    Right Hip External Rotation   20    Right Hip Internal Rotation   41    Left Hip Flexion  105    Left Hip External Rotation   25    Left Hip Internal Rotation   45      Strength   Right Hip External Rotation   4/5    Right Hip Internal Rotation  4+/5    Left Hip External Rotation  4/5    Left Hip Internal Rotation  4+/5                   [facility] Adult PT Treatment/Exercise - 08/03/19 0001      Exercises   Exercises  Lumbar;Knee/Hip      Lumbar Exercises: Stretches   Single Knee to Chest Stretch  Right;Left;30 seconds;2 reps    Single Knee to Chest Stretch Limitations  good relief    Double Knee to Chest Stretch  30 seconds;2 reps    Double Knee to Chest Stretch Limitations  good relief      Lumbar Exercises: Aerobic   Nustep  L4 x 6 min (UEs/LEs)      Lumbar Exercises: Seated   Other Seated Lumbar Exercises  prayer stretch with green pball 10x3"; to R/L 5x3" to tolerance      Knee/Hip Exercises: Stretches   Passive Hamstring Stretch  Right;Left;1 rep;30 seconds    Passive Hamstring Stretch Limitations  sitting with heel on stool    Hip Flexor Stretch  Right;Left;1 rep;30 seconds    Hip Flexor Stretch Limitations  sitting on edge of chair    Piriformis Stretch  Right;Left;1 rep;30 seconds    Piriformis Stretch Limitations  sitting fig 4       Knee/Hip Exercises: Standing   Wall Squat  1 set;10 reps    Wall Squat Limitations  cues to decrease depth and maintain upright posture      Knee/Hip Exercises: Seated   Abduction/Adduction   Strengthening;Right;Left;1 set;10 reps    Abd/Adduction Limitations  at counter top   eavy cues for upright posture and correction of lateral trun            PT Education - 08/03/19 1059    Education Details  update to HEP for improved comfort; advised patient to avoid activities with potential to flare LBP    Person(s) Educated  Patient    Methods  Explanation;Demonstration;Tactile cues;Verbal cues;Handout    Comprehension  Verbalized understanding;Returned demonstration       PT Short Term Goals - 08/03/19 1107      PT SHORT TERM GOAL #1   Title  Patient to be independent with initial HEP.    Time  3    Period  Weeks    Status   On-going    Target Date  08/16/19        PT Long Term Goals - 08/03/19 1107      PT LONG TERM GOAL #1   Title  Patient to be independent with advanced HEP.    Time  6    Period  Weeks    Status  On-going      PT LONG TERM GOAL #2   Title  Patient to demonstrate B LE strength >/=4+/5.    Time  6    Period  Weeks    Status  On-going      PT LONG TERM GOAL #3   Title  Patient to demonstrate B HS, hip flexor/quad, and piriformis flexibility with moderate tightness on R, mild tightness on L remaining.    Time  6    Period  Weeks    Status  On-going      PT LONG TERM GOAL #4   Title  Patient to report 0/10 pain when lifting 15lbs box from floor with good lifting mechanics.    Time  6    Period  Weeks    Status  On-going      PT LONG TERM GOAL #5   Title  Patient to report 60% improvement in LBP.    Time  6    Period  Weeks    Status  On-going            Plan - 08/03/19 1101    Clinical Impression Statement  Patient arrived to session discouraged by recent increase in R sided LBP. Attributes this partially to doing some handywork requiring stooping and using a reciprocal saw on Friday. Spoke with patient about avoiding these activities for the time being as they match closely to patient's aggravating factors- patient reported understanding. Patient demonstrated B hip ER ROM and strength limitation with assessment. Demonstrated limited supine positioning tolerance d/t LBP. Thus, modified all HEP activities to sitting with better tolerance. Exception was [facility] and [facility] stretches in supine as patient did report good relief of LBP from these. Worked on standing LE strengthening ther-ex with consistent cues for upright posture- patient tolerated these  well. Reported understanding of new HEP update and left session without pain and much more encouraged at end of session.    Comorbidities  B total hip replacement, prostate surgery    PT Treatment/Interventions  ADLs/Self Care Home  Management;Cryotherapy;Electrical Stimulation;Moist Heat;Balance training;Therapeutic exercise;Therapeutic activities;Functional mobility training;Stair training;Gait training;Ultrasound;Neuromuscular re-education;Patient/family education;Manual techniques;Taping;Energy conservation;Dry needling;Passive range of motion    PT Next Visit Plan  reassess HEP    Consulted and Agree with Plan of Care  Patient       Patient will benefit from skilled therapeutic intervention in order to improve the following deficits and impairments:  Decreased activity tolerance, Decreased strength, Pain, Difficulty walking, Increased muscle spasms, Improper body mechanics, Decreased range of motion, Impaired flexibility, Postural dysfunction  Visit Diagnosis: Chronic right-sided low back pain without sciatica  Other symptoms and signs involving the musculoskeletal system  Abnormal posture     Problem List Patient Active Problem List   Diagnosis Date Noted  . Gastroesophageal reflux disease without esophagitis 06/17/2015  . Allergic reaction 04/24/2015  . Rhus dermatitis 01/04/2015  . Screening for ischemic heart disease 11/12/2014  . Prostate cancer screening 11/12/2014  . Routine history and physical examination of adult 11/12/2014  . Need for prophylactic vaccination against Streptococcus pneumoniae (pneumococcus) 11/12/2014  . Encounter for preventive health examination 12/20/2012    Janene Harvey, PT, DPT 08/03/19 11:08 AM    [facility] [address]  [place], [place], [address] Phone: [phone]   Fax:  [phone]  Name: JAMARQUES PINEDO MRN: [id number] Date of Birth: [birth year]

## 2019-08-09 ENCOUNTER — Other Ambulatory Visit: Payer: Self-pay

## 2019-08-09 ENCOUNTER — Ambulatory Visit: Payer: Medicare Other | Admitting: Physical Therapy

## 2019-08-09 ENCOUNTER — Encounter: Payer: Self-pay | Admitting: Physical Therapy

## 2019-08-09 DIAGNOSIS — R293 Abnormal posture: Secondary | ICD-10-CM | POA: Diagnosis not present

## 2019-08-09 DIAGNOSIS — G8929 Other chronic pain: Secondary | ICD-10-CM

## 2019-08-09 DIAGNOSIS — R29898 Other symptoms and signs involving the musculoskeletal system: Secondary | ICD-10-CM | POA: Diagnosis not present

## 2019-08-09 DIAGNOSIS — M545 Low back pain: Secondary | ICD-10-CM | POA: Diagnosis not present

## 2019-08-09 NOTE — Patient Instructions (Signed)

## 2019-08-09 NOTE — Therapy (Addendum)
[facility] [address]  [place], [place], [address] Phone: [phone]   Fax:  [phone]  Physical Therapy Treatment  Patient Details  Name: Roger Williams MRN: [id number] Date of Birth: July 01, 1940 Referring Provider (PT): Mackie Pai, PA-C   Progress Note Reporting Period 07/26/19 to 08/09/19  See note below for Objective Data and Assessment of Progress/Goals.     Encounter Date: 08/09/2019  PT End of Session - 08/09/19 0928    Visit Number  3    Number of Visits  7    Date for PT Re-Evaluation  09/06/19    Authorization Type  [employer] Medicare    PT Start Time  0845    PT Stop Time  0927    PT Time Calculation (min)  42 min    Activity Tolerance  Patient tolerated treatment well    Behavior During Therapy  [facility] for tasks assessed/performed       History reviewed. No pertinent past medical history.  Past Surgical History:  Procedure Laterality Date  . HERNIA REPAIR    . JOINT REPLACEMENT    . PROSTATE SURGERY    . TONSILLECTOMY      There were no vitals filed for this visit.  Subjective Assessment - 08/09/19 0850    Subjective  Feels like he is getting better. Has only had one episode of sharp pain. HEP is feeling better.    Pertinent History  B total hip replacement, prostate surgery    Diagnostic tests  07/04/19 lumbar spine xray: Multilevel facet osteoarthritis and listhesis. Lower lumbar disc degeneration. No acute finding.    Patient Stated Goals  get rid of pain    Currently in Pain?  No/denies                       [facility] Adult PT Treatment/Exercise - 08/09/19 0001      Lumbar Exercises: Stretches   Passive Hamstring Stretch  Right;Left;1 rep;30 seconds    Passive Hamstring Stretch Limitations  foot resting on stool'    Hip Flexor Stretch  Right;Left;1 rep;30 seconds    Hip Flexor Stretch Limitations  sitting on edge of mat    Figure 4 Stretch  1 rep;30 seconds;With  overpressure;Seated    Figure 4 Stretch Limitations  to tolerance    Other Lumbar Stretch Exercise  R/L QL stretch in doorway 30" each      Lumbar Exercises: Aerobic   Nustep  L3 x 6 min (LEs only)      Knee/Hip Exercises: Standing   Hip Abduction  Stengthening;Right;Left;1 set;10 reps;Knee straight    Abduction Limitations  yellow loop around ankles; unable to perform with red TB   heavy cues for upright posture   Other Standing Knee Exercises  R & L hip abduction x5 each at counter   heavy cues for form            PT Education - 08/09/19 0927    Education Details  update to HEP; edu on posture & body mechanics with ADLs    Person(s) Educated  Patient    Methods  Explanation;Demonstration;Tactile cues;Verbal cues;Handout    Comprehension  Verbalized understanding;Returned demonstration       PT Short Term Goals - 08/09/19 1032      PT SHORT TERM GOAL #1   Title  Patient to be independent with initial HEP.    Time  3    Period  Weeks  Status  Achieved    Target Date  08/16/19        PT Long Term Goals - 08/03/19 1107      PT LONG TERM GOAL #1   Title  Patient to be independent with advanced HEP.    Time  6    Period  Weeks    Status  On-going      PT LONG TERM GOAL #2   Title  Patient to demonstrate B LE strength >/=4+/5.    Time  6    Period  Weeks    Status  On-going      PT LONG TERM GOAL #3   Title  Patient to demonstrate B HS, hip flexor/quad, and piriformis flexibility with moderate tightness on R, mild tightness on L remaining.    Time  6    Period  Weeks    Status  On-going      PT LONG TERM GOAL #4   Title  Patient to report 0/10 pain when lifting 15lbs box from floor with good lifting mechanics.    Time  6    Period  Weeks    Status  On-going      PT LONG TERM GOAL #5   Title  Patient to report 60% improvement in LBP.    Time  6    Period  Weeks    Status  On-going            Plan - 08/09/19 2952    Clinical Impression  Statement  Patient reporting improvement in LBP since last session, noting only 1 episode of sharp pain. Also reporting better exercise tolerance since HEP update. Reviewed LE stretching, with patient demonstrating and reporting increased tightness on R LE vs. L. Educated patient on proper body mechanics and adjustments to household chores to improve tolerance for activities- all questions answered, and suggested use of garden stool for pulling weeds. Worked on standing hip abduction with light banded resistance. Patient demonstrated tendency to lean forward and compensate with TFL, requiring heavy verbal and manual cues to correct. Slightly improved form without resistance. Updated this exercise into HEP for continued practice of proper form. Patient reported understanding and without complaints at end of session. Patient progressing towards goals.    Comorbidities  B total hip replacement, prostate surgery    PT Treatment/Interventions  ADLs/Self Care Home Management;Cryotherapy;Electrical Stimulation;Moist Heat;Balance training;Therapeutic exercise;Therapeutic activities;Functional mobility training;Stair training;Gait training;Ultrasound;Neuromuscular re-education;Patient/family education;Manual techniques;Taping;Energy conservation;Dry needling;Passive range of motion    PT Next Visit Plan  progress hip strengthening    Consulted and Agree with Plan of Care  Patient       Patient will benefit from skilled therapeutic intervention in order to improve the following deficits and impairments:  Decreased activity tolerance, Decreased strength, Pain, Difficulty walking, Increased muscle spasms, Improper body mechanics, Decreased range of motion, Impaired flexibility, Postural dysfunction  Visit Diagnosis: Chronic right-sided low back pain without sciatica  Other symptoms and signs involving the musculoskeletal system  Abnormal posture     Problem List Patient Active Problem List   Diagnosis Date  Noted  . Gastroesophageal reflux disease without esophagitis 06/17/2015  . Allergic reaction 04/24/2015  . Rhus dermatitis 01/04/2015  . Screening for ischemic heart disease 11/12/2014  . Prostate cancer screening 11/12/2014  . Routine history and physical examination of adult 11/12/2014  . Need for prophylactic vaccination against Streptococcus pneumoniae (pneumococcus) 11/12/2014  . Encounter for preventive health examination 12/20/2012    Janene Harvey, PT, DPT 08/09/19 10:33  AM   [facility] [address]  [place], [place], [address] Phone: [phone]   Fax:  [phone]  Name: JYDEN KROMER MRN: [id number] Date of Birth: April 19, 1941  PHYSICAL THERAPY DISCHARGE SUMMARY  Visits from Start of Care: 3  Current functional level related to goals / functional outcomes: Unable to assess; patient did not return   Remaining deficits: Unable to assess   Education / Equipment: HEP  Plan: Patient agrees to discharge.  Patient goals were not met. Patient is being discharged due to not returning since the last visit.  ?????     Janene Harvey, PT, DPT 09/28/19 8:44 AM

## 2019-08-15 ENCOUNTER — Ambulatory Visit: Payer: Medicare Other | Attending: Internal Medicine

## 2019-08-15 DIAGNOSIS — Z23 Encounter for immunization: Secondary | ICD-10-CM

## 2019-08-15 NOTE — Progress Notes (Signed)
   Covid-19 Vaccination Clinic  Name:  Roger Williams    MRN: [id number] DOB: 17-Apr-1941  08/15/2019  [name] was observed post Covid-19 immunization for 15 minutes without incident. He was provided with Vaccine Information Sheet and instruction to access the V-Safe system.   [name] was instructed to call 911 with any severe reactions post vaccine: Marland Kitchen Difficulty breathing  . Swelling of face and throat  . A fast heartbeat  . A bad rash all over body  . Dizziness and weakness   Immunizations Administered    Name Date Dose VIS Date Route   Pfizer COVID-19 Vaccine 08/15/2019 12:13 PM 0.3 mL 04/28/2019 Intramuscular   Manufacturer: [employer], [employer]   Lot: [id number]   NDC: [address]-6

## 2019-08-16 ENCOUNTER — Ambulatory Visit: Payer: Medicare Other

## 2019-08-23 ENCOUNTER — Ambulatory Visit: Payer: Medicare Other | Admitting: Physical Therapy

## 2019-10-17 DIAGNOSIS — H524 Presbyopia: Secondary | ICD-10-CM | POA: Diagnosis not present

## 2019-10-17 DIAGNOSIS — H35033 Hypertensive retinopathy, bilateral: Secondary | ICD-10-CM | POA: Diagnosis not present

## 2019-10-30 DIAGNOSIS — H2511 Age-related nuclear cataract, right eye: Secondary | ICD-10-CM | POA: Diagnosis not present

## 2019-10-30 DIAGNOSIS — H2513 Age-related nuclear cataract, bilateral: Secondary | ICD-10-CM | POA: Diagnosis not present

## 2019-11-23 DIAGNOSIS — H2511 Age-related nuclear cataract, right eye: Secondary | ICD-10-CM | POA: Diagnosis not present

## 2019-11-23 HISTORY — PX: EYE SURGERY: SHX253

## 2019-11-24 DIAGNOSIS — H2512 Age-related nuclear cataract, left eye: Secondary | ICD-10-CM | POA: Diagnosis not present

## 2019-11-24 DIAGNOSIS — H25042 Posterior subcapsular polar age-related cataract, left eye: Secondary | ICD-10-CM | POA: Diagnosis not present

## 2019-11-24 DIAGNOSIS — H25012 Cortical age-related cataract, left eye: Secondary | ICD-10-CM | POA: Diagnosis not present

## 2019-12-04 NOTE — Progress Notes (Addendum)
I connected with Roger Williams today by telephone and verified that I am speaking with the correct person using two identifiers. Location patient: home Location provider: work Persons participating in the virtual visit: patient, Engineer, civil (consulting).    I discussed the limitations, risks, security and privacy concerns of performing an evaluation and management service by telephone and the availability of in person appointments. I also discussed with the patient that there may be a patient responsible charge related to this service. The patient expressed understanding and verbally consented to this telephonic visit.    Interactive audio and video telecommunications were attempted between this provider and patient, however failed, due to patient having technical difficulties OR patient did not have access to video capability.  We continued and completed visit with audio only.  Some vital signs may be absent or patient reported.   Subjective:   Roger Williams is a 79 y.o. male who presents for Medicare Annual/Subsequent preventive examination.  Review of Systems    Cardiac Risk Factors include: male gender;advanced age (>67men, >58 women)     Objective:    There were no vitals filed for this visit. There is no height or weight on file to calculate BMI.  Advanced Directives 12/05/2019 07/26/2019 04/30/2019 06/03/2018  Does Patient Have a Medical Advance Directive? Yes Yes Yes Yes  Type of Advance Directive Living will - Living will Healthcare Power of [place];Living will  Does patient want to make changes to medical advance directive? No - Patient declined No - Patient declined No - Patient declined -  Copy of Healthcare Power of Attorney in Chart? - - - No - copy requested    Current Medications (verified) Outpatient Encounter Medications as of 12/05/2019  Medication Sig  . aspirin 81 MG tablet Take 81 mg by mouth daily.  . Multiple Vitamin (MULTIVITAMIN) tablet Take 1 tablet by mouth daily.  . Ranitidine  HCl (ACID CONTROL PO) Take 50 mg by mouth daily. OTC Acutate   . cyclobenzaprine (FLEXERIL) 5 MG tablet Take 1 tablet (5 mg total) by mouth at bedtime. (Patient not taking: Reported on 07/26/2019)  . diclofenac (VOLTAREN) 75 MG EC tablet Take 1 tablet (75 mg total) by mouth 2 (two) times daily. (Patient not taking: Reported on 07/26/2019)   No facility-administered encounter medications on file as of 12/05/2019.    Allergies (verified) Patient has no known allergies.   History: History reviewed. No pertinent past medical history. Past Surgical History:  Procedure Laterality Date  . EYE SURGERY Right 11/23/2019   cataract  . HERNIA REPAIR    . JOINT REPLACEMENT    . PROSTATE SURGERY    . TONSILLECTOMY     Family History  Problem Relation Age of Onset  . Parkinson's disease Father   . Parkinson's disease Brother    Social History   Socioeconomic History  . Marital status: Married    Spouse name: Not on file  . Number of children: Not on file  . Years of education: Not on file  . Highest education level: Not on file  Occupational History  . Not on file  Tobacco Use  . Smoking status: Never Smoker  . Smokeless tobacco: Never Used  Vaping Use  . Vaping Use: Never used  Substance and Sexual Activity  . Alcohol use: No  . Drug use: No  . Sexual activity: Yes  Other Topics Concern  . Not on file  Social History Narrative  . Not on file   Social Determinants of Health  Financial Resource Strain: Low Risk   . Difficulty of Paying Living Expenses: Not hard at all  Food Insecurity: No Food Insecurity  . Worried About Programme researcher, broadcasting/film/video in the Last Year: Never true  . Ran Out of Food in the Last Year: Never true  Transportation Needs: No Transportation Needs  . Lack of Transportation (Medical): No  . Lack of Transportation (Non-Medical): No  Physical Activity:   . Days of Exercise per Week:   . Minutes of Exercise per Session:   Stress:   . Feeling of Stress :     Social Connections:   . Frequency of Communication with Friends and Family:   . Frequency of Social Gatherings with Friends and Family:   . Attends Religious Services:   . Active Member of Clubs or Organizations:   . Attends Banker Meetings:   Marland Kitchen Marital Status:     Tobacco Counseling Counseling given: Not Answered   Clinical Intake: Pain : No/denies pain    Activities of Daily Living In your present state of health, do you have any difficulty performing the following activities: 12/05/2019  Hearing? N  Vision? N  Difficulty concentrating or making decisions? N  Walking or climbing stairs? N  Dressing or bathing? N  Doing errands, shopping? N  Preparing Food and eating ? N  Using the Toilet? N  In the past six months, have you accidently leaked urine? N  Do you have problems with loss of bowel control? N  Managing your Medications? N  Managing your Finances? N  Housekeeping or managing your Housekeeping? N  Some recent data might be hidden    Patient Care Team: Saguier, Kateri Mc as PCP - General (Internal Medicine)  Indicate any recent Medical Services you may have received from other than [facility] providers in the past year (date may be approximate).     Assessment:   This is a routine wellness examination for Roger Williams.  Dietary issues and exercise activities discussed: Current Exercise Habits: Home exercise routine, Type of exercise: walking;stretching, Time (Minutes): 30, Frequency (Times/Week): 5, Weekly Exercise (Minutes/Week): 150, Intensity: Mild, Exercise limited by: None identified Diet (meal preparation, eat out, water intake, caffeinated beverages, dairy products, fruits and vegetables): well balanced   Goals    . DIET - EAT MORE FRUITS AND VEGETABLES    . DIET - REDUCE SODIUM INTAKE    . Maintain healthy active lifestyle      Depression Screen PHQ 2/9 Scores 12/05/2019 06/03/2018 12/25/2015 11/12/2014  PHQ - 2 Score 0 0 0 0  PHQ- 9 Score - -  0 -    Fall Risk Fall Risk  12/05/2019 06/03/2018 12/25/2015 12/25/2015 11/12/2014  Falls in the past year? 0 0 No No No  Number falls in past yr: 0 - - - -  Injury with Fall? 0 - - - -  Follow up Education provided;Falls prevention discussed - - - -   Lives w/ wife in 1 story home. 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 Any stairs in or around the home? Yes  If so, are there any without handrails? No  Home free of loose throw rugs in walkways, pet beds, electrical cords, etc? Yes  Adequate lighting in your home to reduce risk of falls? Yes   ASSISTIVE DEVICES UTILIZED TO PREVENT FALLS:  Life alert? No  Use of a cane, walker or w/c? No  Grab bars in the bathroom? No  Shower chair or bench in shower? No  Elevated toilet seat  or a handicapped toilet? No    Cognitive Function: Ad8 score reviewed for issues:  Issues making decisions:no  Less interest in hobbies / activities: no  Repeats questions, stories (family complaining):no  Trouble using ordinary gadgets (microwave, computer, phone):no  Forgets the month or year: no  Mismanaging finances: no  Remembering appts:no  Daily problems with thinking and/or memory:no Ad8 score is=0            Immunizations Immunization History  Administered Date(s) Administered  . Influenza Split 02/15/2018  . Influenza, High Dose Seasonal PF 04/10/2015  . Influenza-Unspecified 02/01/2013, 03/21/2014, 02/10/2016  . PFIZER SARS-COV-2 Vaccination 07/16/2019, 08/15/2019  . Pneumococcal Conjugate-13 11/12/2014  . Pneumococcal Polysaccharide-23 12/25/2015  . Tdap 10/26/2012  . Zoster 03/21/2013  . Zoster Recombinat (Shingrix) 09/04/2016    TDAP status: Up to date Pneumococcal vaccine status: Up to date Covid-19 vaccine status: Completed vaccines  Qualifies for Shingles Vaccine? Yes   Zostavax completed Yes     Screening Tests Health Maintenance  Topic Date Due  . Hepatitis C Screening  Never done  . INFLUENZA VACCINE  12/17/2019  .  TETANUS/TDAP  10/27/2022  . COVID-19 Vaccine  Completed  . PNA vac Low Risk Adult  Completed    Health Maintenance  Health Maintenance Due  Topic Date Due  . Hepatitis C Screening  Never done    Colorectal cancer screening: Completed 2012 per pt . Repeat every (not on file) years  Lung Cancer Screening: (Low Dose CT Chest recommended if Age 59-80 years, 30 pack-year currently smoking OR have quit w/in 15years.) does not qualify.    Additional Screening:  Vision Screening: Recommended annual ophthalmology exams for early detection of glaucoma and other disorders of the eye. Is the patient up to date with their annual eye exam?  Yes    Dental Screening: Recommended annual dental exams for proper oral hygiene  Community Resource Referral / Chronic Care Management: CRR required this visit?  No   CCM required this visit?  No      Plan:   See you next year!  Continue to eat heart healthy diet (full of fruits, vegetables, whole grains, lean protein, water--limit salt, fat, and sugar intake) and increase physical activity as tolerated.  Continue doing brain stimulating activities (puzzles, reading, adult coloring books, staying active) to keep memory sharp.   Bring a copy of your living will and/or healthcare power of attorney to your next office visit.   I have personally reviewed and noted the following in the patient's chart:   . Medical and social history . Use of alcohol, tobacco or illicit drugs  . Current medications and supplements . Functional ability and status . Nutritional status . Physical activity . Advanced directives . List of other physicians . Hospitalizations, surgeries, and ER visits in previous 12 months . Vitals . Screenings to include cognitive, depression, and falls . Referrals and appointments  In addition, I have reviewed and discussed with patient certain preventive protocols, quality metrics, and best practice recommendations. A written  personalized care plan for preventive services as well as general preventive health recommendations were provided to patient.    Due to this being a telephonic visit, the after visit summary with patients personalized plan was offered to patient via mail or my-chart. Patient declined at this time.  [place]on [place], [place]   12/05/2019   Nurse Notes: Retired Gaffer.   Reviewed agree with assesment & plan of RN.  Esperanza Richters, PA-C

## 2019-12-05 ENCOUNTER — Other Ambulatory Visit: Payer: Self-pay

## 2019-12-05 ENCOUNTER — Ambulatory Visit (INDEPENDENT_AMBULATORY_CARE_PROVIDER_SITE_OTHER): Payer: Medicare Other | Admitting: *Deleted

## 2019-12-05 ENCOUNTER — Encounter: Payer: Self-pay | Admitting: *Deleted

## 2019-12-05 DIAGNOSIS — Z0000 Encounter for general adult medical examination without abnormal findings: Secondary | ICD-10-CM

## 2019-12-05 NOTE — Patient Instructions (Signed)
See you next year!  Continue to eat heart healthy diet (full of fruits, vegetables, whole grains, lean protein, water--limit salt, fat, and sugar intake) and increase physical activity as tolerated.  Continue doing brain stimulating activities (puzzles, reading, adult coloring books, staying active) to keep memory sharp.   Bring a copy of your living will and/or healthcare power of attorney to your next office visit.   Roger Williams , Thank you for taking time to come for your Medicare Wellness Visit. I appreciate your ongoing commitment to your health goals. Please review the following plan we discussed and let me know if I can assist you in the future.   These are the goals we discussed: Goals    . DIET - EAT MORE FRUITS AND VEGETABLES    . DIET - REDUCE SODIUM INTAKE    . Maintain healthy active lifestyle       This is a list of the screening recommended for you and due dates:  Health Maintenance  Topic Date Due  .  Hepatitis C: One time screening is recommended by Center for Disease Control  (CDC) for  adults born from [birth year] through 1966.   Never done  . Flu Shot  12/17/2019  . Tetanus Vaccine  10/27/2022  . COVID-19 Vaccine  Completed  . Pneumonia vaccines  Completed    Preventive Care 69 Years and Older, Male Preventive care refers to lifestyle choices and visits with your health care provider that can promote health and wellness. This includes:  A yearly physical exam. This is also called an annual well check.  Regular dental and eye exams.  Immunizations.  Screening for certain conditions.  Healthy lifestyle choices, such as diet and exercise. What can I expect for my preventive care visit? Physical exam Your health care provider will check:  Height and weight. These may be used to calculate body mass index (BMI), which is a measurement that tells if you are at a healthy weight.  Heart rate and blood pressure.  Your skin for abnormal spots. Counseling Your health  care provider may ask you questions about:  Alcohol, tobacco, and drug use.  Emotional well-being.  Home and relationship well-being.  Sexual activity.  Eating habits.  History of falls.  Memory and ability to understand (cognition).  Work and work Statistician. What immunizations do I need?  Influenza (flu) vaccine  This is recommended every year. Tetanus, diphtheria, and pertussis (Tdap) vaccine  You may need a Td booster every 10 years. Varicella (chickenpox) vaccine  You may need this vaccine if you have not already been vaccinated. Zoster (shingles) vaccine  You may need this after age 65. Pneumococcal conjugate (PCV13) vaccine  One dose is recommended after age 31. Pneumococcal polysaccharide (PPSV23) vaccine  One dose is recommended after age 53. Measles, mumps, and rubella (MMR) vaccine  You may need at least one dose of MMR if you were born in 1957 or later. You may also need a second dose. Meningococcal conjugate (MenACWY) vaccine  You may need this if you have certain conditions. Hepatitis A vaccine  You may need this if you have certain conditions or if you travel or work in places where you may be exposed to hepatitis A. Hepatitis B vaccine  You may need this if you have certain conditions or if you travel or work in places where you may be exposed to hepatitis B. Haemophilus influenzae type b (Hib) vaccine  You may need this if you have certain conditions. You may  receive vaccines as individual doses or as more than one vaccine together in one shot (combination vaccines). Talk with your health care provider about the risks and benefits of combination vaccines. What tests do I need? Blood tests  Lipid and cholesterol levels. These may be checked every 5 years, or more frequently depending on your overall health.  Hepatitis C test.  Hepatitis B test. Screening  Lung cancer screening. You may have this screening every year starting at age 71 if  you have a 30-pack-year history of smoking and currently smoke or have quit within the past 15 years.  Colorectal cancer screening. All adults should have this screening starting at age 5 and continuing until age 42. Your health care provider may recommend screening at age 49 if you are at increased risk. You will have tests every 1-10 years, depending on your results and the type of screening test.  Prostate cancer screening. Recommendations will vary depending on your family history and other risks.  Diabetes screening. This is done by checking your blood sugar (glucose) after you have not eaten for a while (fasting). You may have this done every 1-3 years.  Abdominal aortic aneurysm (AAA) screening. You may need this if you are a current or former smoker.  Sexually transmitted disease (STD) testing. Follow these instructions at home: Eating and drinking  Eat a diet that includes fresh fruits and vegetables, whole grains, lean protein, and low-fat dairy products. Limit your intake of foods with high amounts of sugar, saturated fats, and salt.  Take vitamin and mineral supplements as recommended by your health care provider.  Do not drink alcohol if your health care provider tells you not to drink.  If you drink alcohol: ? Limit how much you have to 0-2 drinks a day. ? Be aware of how much alcohol is in your drink. In the [place] one drink equals one 12 oz bottle of beer (355 mL), one 5 oz glass of wine (148 mL), or one 1 oz glass of hard liquor (44 mL). Lifestyle  Take daily care of your teeth and gums.  Stay active. Exercise for at least 30 minutes on 5 or more days each week.  Do not use any products that contain nicotine or tobacco, such as cigarettes, e-cigarettes, and chewing tobacco. If you need help quitting, ask your health care provider.  If you are sexually active, practice safe sex. Use a condom or other form of protection to prevent STIs (sexually transmitted  infections).  Talk with your health care provider about taking a low-dose aspirin or statin. What's next?  Visit your health care provider once a year for a well check visit.  Ask your health care provider how often you should have your eyes and teeth checked.  Stay up to date on all vaccines. This information is not intended to replace advice given to you by your health care provider. Make sure you discuss any questions you have with your health care provider. Document Revised: 04/28/2018 Document Reviewed: 04/28/2018 Elsevier Patient Education  2020 [employer].

## 2019-12-07 DIAGNOSIS — H2512 Age-related nuclear cataract, left eye: Secondary | ICD-10-CM | POA: Diagnosis not present

## 2019-12-14 DIAGNOSIS — H2512 Age-related nuclear cataract, left eye: Secondary | ICD-10-CM | POA: Diagnosis not present

## 2020-07-15 ENCOUNTER — Encounter: Payer: Self-pay | Admitting: Family Medicine

## 2020-07-15 ENCOUNTER — Ambulatory Visit (INDEPENDENT_AMBULATORY_CARE_PROVIDER_SITE_OTHER): Payer: Medicare Other | Admitting: Family Medicine

## 2020-07-15 ENCOUNTER — Other Ambulatory Visit: Payer: Self-pay

## 2020-07-15 VITALS — BP 140/82 | HR 60 | Temp 97.8°F | Ht 70.0 in | Wt 187.5 lb

## 2020-07-15 DIAGNOSIS — L255 Unspecified contact dermatitis due to plants, except food: Secondary | ICD-10-CM | POA: Diagnosis not present

## 2020-07-15 MED ORDER — METHYLPREDNISOLONE ACETATE 80 MG/ML IJ SUSP
80.0000 mg | Freq: Once | INTRAMUSCULAR | Status: AC
  Administered 2020-07-15: 80 mg via INTRAMUSCULAR

## 2020-07-15 MED ORDER — PREDNISONE 20 MG PO TABS: ORAL_TABLET | ORAL | 0 refills | Status: DC

## 2020-07-15 NOTE — Patient Instructions (Addendum)
Try not to itch.  OK to continue calamine lotion.  Start the prednisone tomorrow. Stop after 1 week. If you have rebound itching, finish the course. If not, stop.   Ice/cold pack over area for 10-15 min twice daily.  Claritin (loratadine), Allegra (fexofenadine), Zyrtec (cetirizine) which is also equivalent to Xyzal (levocetirizine); these are listed in order from weakest to strongest. Generic, and therefore cheaper, options are in the parentheses.   There are available OTC, and the generic versions, which may be cheaper, are in parentheses. Show this to a pharmacist if you have trouble finding any of these items.  Let us know if you need anything.

## 2020-07-15 NOTE — Progress Notes (Signed)
Chief Complaint  Patient presents with  . Poison Ivy    [name] is a 80 y.o. male here for a skin complaint.  Duration: 3 days Location: forearms, thighs, abd Pruritic? Yes Painful? No Drainage? No He was exposed to poison ivy Other associated symptoms: spreading Therapies tried thus far: Calamine lotion  History reviewed. No pertinent past medical history.  BP 140/82 (BP Location: Left Arm, Patient Position: Sitting, Cuff Size: Normal)   Pulse 60   Temp 97.8 F (36.6 C) (Oral)   Ht 5\' 10"  (1.778 m)   Wt 187 lb 8 oz (85 kg)   SpO2 97%   BMI 26.90 kg/m  Gen: awake, alert, appearing stated age Lungs: No accessory muscle use Skin: erythematous patches on L forearm and R distal lateral leg. No drainage, vesicles, TTP, fluctuance, excoriation Psych: Age appropriate judgment and insight  Rhus dermatitis - Plan: predniSONE (DELTASONE) 20 MG tablet, methylPREDNISolone acetate (DEPO-MEDROL) injection 80 mg  Depo injection today. Start prednisone tomorrow. He usually does well after several days of prednisone rather than needing a longer taper. I told him to take 1 week course of steroid and then stop. If he needs more due to rebound pruritus, he will finish the prescription. Try not to itch. Cold/ice can be helpful. Okay to continue calamine lotion. Consider oral antihistamines. F/u prn. The patient voiced understanding and agreement to the plan.  [place], DO 07/15/20 2:25 PM

## 2020-08-07 IMAGING — DX DG CHEST 1V PORT
1 series · 1 of 1 positions shown · non-contrast
Comparison: 12/20/2012

CLINICAL DATA: Chills and loss of appetite

EXAM:
PORTABLE CHEST 1 VIEW

[chest ap]
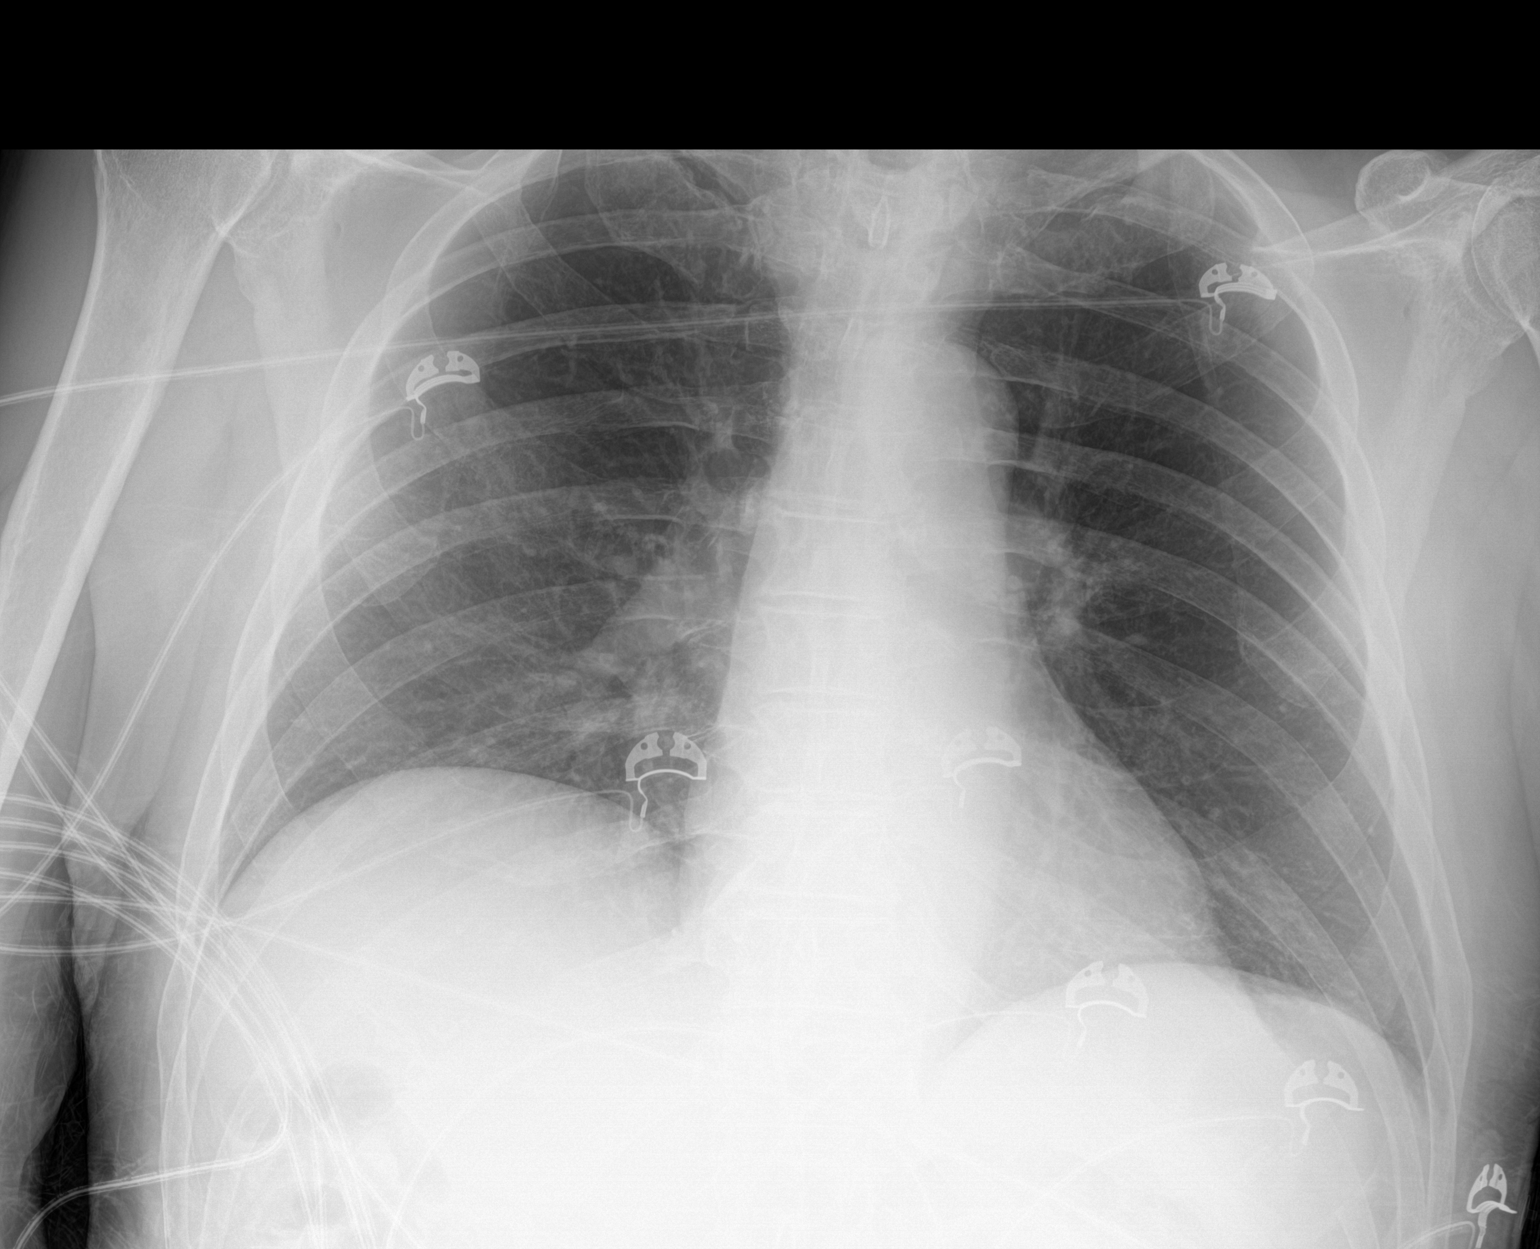

[1 of 1 positions shown; findings below may reference images not displayed]

FINDINGS: Cardiac shadow is within normal limits. The lungs are well aerated
bilaterally. No focal infiltrate or sizable effusion is seen. No
bony abnormality is noted.
IMPRESSION: No active disease.

## 2020-10-02 ENCOUNTER — Other Ambulatory Visit: Payer: Self-pay

## 2020-10-02 ENCOUNTER — Ambulatory Visit (HOSPITAL_BASED_OUTPATIENT_CLINIC_OR_DEPARTMENT_OTHER)
Admission: RE | Admit: 2020-10-02 | Discharge: 2020-10-02 | Disposition: A | Discharge status: Home / Self Care | Payer: Medicare Other | Source: Ambulatory Visit | Attending: Medical | Admitting: Medical

## 2020-10-02 ENCOUNTER — Ambulatory Visit (INDEPENDENT_AMBULATORY_CARE_PROVIDER_SITE_OTHER): Payer: Medicare Other | Admitting: Medical

## 2020-10-02 ENCOUNTER — Encounter: Payer: Self-pay | Admitting: Medical

## 2020-10-02 VITALS — BP 130/60 | HR 65 | Resp 18 | Ht 70.0 in | Wt 184.6 lb

## 2020-10-02 DIAGNOSIS — R739 Hyperglycemia, unspecified: Secondary | ICD-10-CM

## 2020-10-02 DIAGNOSIS — G8929 Other chronic pain: Secondary | ICD-10-CM | POA: Insufficient documentation

## 2020-10-02 DIAGNOSIS — M25561 Pain in right knee: Secondary | ICD-10-CM | POA: Insufficient documentation

## 2020-10-02 DIAGNOSIS — M25461 Effusion, right knee: Secondary | ICD-10-CM | POA: Diagnosis not present

## 2020-10-02 DIAGNOSIS — E785 Hyperlipidemia, unspecified: Secondary | ICD-10-CM | POA: Diagnosis not present
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
## 2020-10-02 NOTE — Patient Instructions (Addendum)
Transient intermittent severe pain with particular movements. Exam mostly negative today except faint swollen. Will get xray of knee. Advise consider one tylenol before you sleep to help deal with pain when you move. After xray review likely refer to sport med. May need [place] evaluation for internal derangement..  History of high cholesterol.  Will get lipid panel today.  Mild elevated blood sugar in the past we will get metabolic panel and A1c today.  Recommend low sugar and low-cholesterol diet.  We will update your lab results.  Follow-up date to be determined after lab review.

## 2020-10-03 LAB — COMPREHENSIVE METABOLIC PANEL
ALT: 22 U/L (ref 0–53)
AST: 26 U/L (ref 0–37)
Albumin: 4.1 g/dL (ref 3.5–5.2)
Alkaline Phosphatase: 69 U/L (ref 39–117)
BUN: 18 mg/dL (ref 6–23)
CO2: 27 mEq/L (ref 19–32)
Calcium: 8.6 mg/dL (ref 8.4–10.5)
Chloride: 104 mEq/L (ref 96–112)
Creatinine, Ser: 1.08 mg/dL (ref 0.40–1.50)
GFR: 64.75 mL/min (ref >60.00)
Glucose, Bld: 124 mg/dL — ABNORMAL HIGH (ref 70–99)
Potassium: 4.8 mEq/L (ref 3.5–5.1)
Sodium: 137 mEq/L (ref 135–145)
Total Bilirubin: 0.9 mg/dL (ref 0.2–1.2)
Total Protein: 6 g/dL (ref 6.0–8.3)

## 2020-10-03 LAB — LIPID PANEL
Cholesterol: 143 mg/dL (ref 0–200)
HDL: 35.3 mg/dL — ABNORMAL LOW (ref >39.00)
LDL Cholesterol: 75 mg/dL (ref 0–99)
NonHDL: 107.92
Total CHOL/HDL Ratio: 4
Triglycerides: 164 mg/dL — ABNORMAL HIGH (ref 0.0–149.0)
VLDL: 32.8 mg/dL (ref 0.0–40.0)

## 2020-10-03 LAB — HEMOGLOBIN A1C: Hgb A1c MFr Bld: 5.8 % (ref 4.6–6.5)

## 2020-10-04 ENCOUNTER — Telehealth: Payer: Self-pay | Admitting: Medical

## 2020-10-04 DIAGNOSIS — G8929 Other chronic pain: Secondary | ICD-10-CM

## 2020-10-04 DIAGNOSIS — M25561 Pain in right knee: Secondary | ICD-10-CM

## 2020-10-04 NOTE — Telephone Encounter (Signed)
Refer to sport med placed. 

## 2020-10-09 ENCOUNTER — Telehealth: Payer: Self-pay

## 2020-10-09 NOTE — Telephone Encounter (Signed)
Pt called and wanted lab results and xray results. Results were advised and also was advised that a letter was sent. Sport med should contact him in regards to his referral . -JMA

## 2020-10-16 ENCOUNTER — Ambulatory Visit: Payer: Self-pay

## 2020-10-16 ENCOUNTER — Other Ambulatory Visit: Payer: Self-pay

## 2020-10-16 ENCOUNTER — Ambulatory Visit: Payer: Medicare Other | Admitting: Family Medicine

## 2020-10-16 VITALS — BP 140/68 | Ht 70.0 in | Wt 180.0 lb

## 2020-10-16 DIAGNOSIS — M25561 Pain in right knee: Secondary | ICD-10-CM

## 2020-10-16 DIAGNOSIS — M1711 Unilateral primary osteoarthritis, right knee: Secondary | ICD-10-CM

## 2020-10-16 MED ORDER — DICLOFENAC SODIUM 1 % EX GEL: 4.0000 g | Freq: Four times a day (QID) | CUTANEOUS | 11 refills | Status: DC

## 2020-10-16 NOTE — Progress Notes (Signed)
  Roger Williams - 80 y.o. male MRN [id number]  Date of birth: 01-08-1941  SUBJECTIVE:  Including CC & ROS.  No chief complaint on file.   Roger Williams is a 80 y.o. male that is presenting with acute right knee pain.  The pain is more anterior in nature.  He does have a history of a right hip arthroplasty that was done in a posterior fashion..  Independent review of the right knee x-ray from 5/18 shows mild degenerative changes.   Review of Systems See HPI   HISTORY: Past Medical, Surgical, Social, and Family History Reviewed & Updated per EMR.   Pertinent Historical Findings include:  No past medical history on file.  Past Surgical History:  Procedure Laterality Date  . EYE SURGERY Right 11/23/2019   cataract  . HERNIA REPAIR    . JOINT REPLACEMENT    . PROSTATE SURGERY    . TONSILLECTOMY      Family History  Problem Relation Age of Onset  . Parkinson's disease Father   . Parkinson's disease Brother     Social History   Socioeconomic History  . Marital status: Married    Spouse name: Not on file  . Number of children: Not on file  . Years of education: Not on file  . Highest education level: Not on file  Occupational History  . Not on file  Tobacco Use  . Smoking status: Never Smoker  . Smokeless tobacco: Never Used  Vaping Use  . Vaping Use: Never used  Substance and Sexual Activity  . Alcohol use: No  . Drug use: No  . Sexual activity: Yes  Other Topics Concern  . Not on file  Social History Narrative  . Not on file   Social Determinants of Health   Financial Resource Strain: Low Risk   . Difficulty of Paying Living Expenses: Not hard at all  Food Insecurity: No Food Insecurity  . Worried About Programme researcher, broadcasting/film/video in the Last Year: Never true  . Ran Out of Food in the Last Year: Never true  Transportation Needs: No Transportation Needs  . Lack of Transportation (Medical): No  . Lack of Transportation (Non-Medical): No  Physical Activity: Not on  file  Stress: Not on file  Social Connections: Not on file  Intimate Partner Violence: Not on file     PHYSICAL EXAM:  VS: BP 140/68   Ht 5\' 10"  (1.778 m)   Wt 180 lb (81.6 kg)   BMI 25.83 kg/m  Physical Exam Gen: NAD, alert, cooperative with exam, well-appearing MSK:  Right knee: No effusion. Normal range of motion. Normal strength resistance. Neurovascular intact  Limited ultrasound: Right knee:  Mild effusion suprapatellar pouch. Normal-appearing quadricep and patellar tendon. Mild degenerative changes of the medial meniscus. Mild to moderate lateral changes of the joint space and outpouching of the meniscus.  Summary: Degenerative lateral changes of the joint space.  Ultrasound and interpretation by , MD    ASSESSMENT & PLAN:   Primary osteoarthritis of right knee Has mild effusion in minor degenerative changes in the lateral compartment.  He also has weakness with hip abduction that is likely contributing. -Counseled on home exercise therapy and supportive care. -Voltaren. -Could consider physical therapy or injection.

## 2020-10-16 NOTE — Patient Instructions (Signed)
Nice to meet you Please try ice  Please try the exercises  Please try the rub on medicine   Please send me a message in MyChart with any questions or updates.  Please see me back in 4 weeks.   --Dr. Timmy Bubeck  

## 2020-10-17 DIAGNOSIS — M1711 Unilateral primary osteoarthritis, right knee: Secondary | ICD-10-CM | POA: Insufficient documentation

## 2020-10-17 NOTE — Assessment & Plan Note (Signed)
Has mild effusion in minor degenerative changes in the lateral compartment.  He also has weakness with hip abduction that is likely contributing. -Counseled on home exercise therapy and supportive care. -Voltaren. -Could consider physical therapy or injection.

## 2020-11-15 ENCOUNTER — Ambulatory Visit: Payer: Medicare Other | Admitting: Family Medicine

## 2020-12-05 ENCOUNTER — Ambulatory Visit: Payer: Medicare Other

## 2020-12-05 ENCOUNTER — Ambulatory Visit (INDEPENDENT_AMBULATORY_CARE_PROVIDER_SITE_OTHER): Payer: Medicare Other

## 2020-12-05 ENCOUNTER — Ambulatory Visit: Payer: Medicare Other | Admitting: *Deleted

## 2020-12-05 ENCOUNTER — Other Ambulatory Visit: Payer: Self-pay

## 2020-12-05 VITALS — BP 150/84 | HR 61 | Temp 98.1°F | Resp 16 | Ht 70.0 in | Wt 186.6 lb

## 2020-12-05 DIAGNOSIS — Z0000 Encounter for general adult medical examination without abnormal findings: Secondary | ICD-10-CM | POA: Diagnosis not present

## 2020-12-05 NOTE — Progress Notes (Signed)
Subjective:   ERWIN NISHIYAMA is a 80 y.o. male who presents for Medicare Annual/Subsequent preventive examination.   Review of Systems     Cardiac Risk Factors include: advanced age (>66men, >75 women);male gender     Objective:    Today's Vitals   12/05/20 1330  BP: (!) 150/84  Pulse: 61  Resp: 16  Temp: 98.1 F (36.7 C)  TempSrc: Temporal  SpO2: 94%  Weight: 186 lb 9.6 oz (84.6 kg)  Height: 5\' 10"  (1.778 m)   Body mass index is 26.77 kg/m.  Advanced Directives 12/05/2020 12/05/2019 07/26/2019 04/30/2019 06/03/2018  Does Patient Have a Medical Advance Directive? Yes Yes Yes Yes Yes  Type of 06/05/2018 of [place];Living will Living will - Living will Healthcare Power of [place];Living will  Does patient want to make changes to medical advance directive? - No - Patient declined No - Patient declined No - Patient declined -  Copy of Healthcare Power of Attorney in Chart? No - copy requested - - - No - copy requested    Current Medications (verified) Outpatient Encounter Medications as of 12/05/2020  Medication Sig   aspirin 81 MG tablet Take 81 mg by mouth daily.   Multiple Vitamin (MULTIVITAMIN) tablet Take 1 tablet by mouth daily.   Ranitidine HCl (ACID CONTROL PO) Take 50 mg by mouth daily. OTC Acutate   diclofenac Sodium (VOLTAREN) 1 % GEL Apply 4 g topically 4 (four) times daily. To affected joint. (Patient not taking: Reported on 12/05/2020)   No facility-administered encounter medications on file as of 12/05/2020.    Allergies (verified) Patient has no known allergies.   History: History reviewed. No pertinent past medical history. Past Surgical History:  Procedure Laterality Date   EYE SURGERY Right 11/23/2019   cataract   HERNIA REPAIR     JOINT REPLACEMENT     PROSTATE SURGERY     TONSILLECTOMY     Family History  Problem Relation Age of Onset   Parkinson's disease Father    Parkinson's disease Brother    Social History    Socioeconomic History   Marital status: Married    Spouse name: Not on file   Number of children: Not on file   Years of education: Not on file   Highest education level: Not on file  Occupational History   Not on file  Tobacco Use   Smoking status: Never   Smokeless tobacco: Never  Vaping Use   Vaping Use: Never used  Substance and Sexual Activity   Alcohol use: No   Drug use: No   Sexual activity: Yes  Other Topics Concern   Not on file  Social History Narrative   Not on file   Social Determinants of Health   Financial Resource Strain: Low Risk    Difficulty of Paying Living Expenses: Not hard at all  Food Insecurity: No Food Insecurity   Worried About 01/24/2020 in the Last Year: Never true   Ran Out of Food in the Last Year: Never true  Transportation Needs: No Transportation Needs   Lack of Transportation (Medical): No   Lack of Transportation (Non-Medical): No  Physical Activity: Insufficiently Active   Days of Exercise per Week: 3 days   Minutes of Exercise per Session: 30 min  Stress: Stress Concern Present   Feeling of Stress : To some extent  Social Connections: Moderately Integrated   Frequency of Communication with Friends and Family: More than three times a  week   Frequency of Social Gatherings with Friends and Family: More than three times a week   Attends Religious Services: More than 4 times per year   Active Member of [employer] or Organizations: No   Attends Engineer, structural: Never   Marital Status: Married    Tobacco Counseling Counseling given: Not Answered   Clinical Intake:  Pre-visit preparation completed: Yes  Pain : No/denies pain     Nutritional Status: BMI 25 -29 Overweight Nutritional Risks: None Diabetes: No  How often do you need to have someone help you when you read instructions, pamphlets, or other written materials from your doctor or pharmacy?: 1 - Never  Diabetic?No  Interpreter Needed?:  No  Information entered by :: Thomasenia Sales LPN   Activities of Daily Living In your present state of health, do you have any difficulty performing the following activities: 12/05/2020  Hearing? N  Vision? N  Difficulty concentrating or making decisions? N  Walking or climbing stairs? N  Dressing or bathing? N  Doing errands, shopping? N  Preparing Food and eating ? N  Using the Toilet? N  In the past six months, have you accidently leaked urine? Y  Comment occasionally  Do you have problems with loss of bowel control? N  Managing your Medications? N  Managing your Finances? N  Housekeeping or managing your Housekeeping? N  Some recent data might be hidden    Patient Care Team: Saguier, Kateri Mc as PCP - General (Internal Medicine)  Indicate any recent Medical Services you may have received from other than [facility] providers in the past year (date may be approximate).     Assessment:   This is a routine wellness examination for Rasheen.  Hearing/Vision screen Hearing Screening - Comments:: No issues Vision Screening - Comments:: Last eye exam-11/2020-[facility]  Dietary issues and exercise activities discussed: Current Exercise Habits: Home exercise routine, Type of exercise: walking, Time (Minutes): 30, Frequency (Times/Week): 3, Weekly Exercise (Minutes/Week): 90, Intensity: Mild, Exercise limited by: None identified   Goals Addressed             This Visit's Progress    Maintain healthy active lifestyle   On track    Patient Stated       Taking care of my wife       Depression Screen PHQ 2/9 Scores 12/05/2020 12/05/2019 06/03/2018 12/25/2015 11/12/2014  PHQ - 2 Score 1 0 0 0 0  PHQ- 9 Score - - - 0 -    Fall Risk Fall Risk  12/05/2020 12/05/2019 06/03/2018 12/25/2015 12/25/2015  Falls in the past year? 0 0 0 No No  Number falls in past yr: 0 0 - - -  Injury with Fall? 0 0 - - -  Follow up Falls prevention discussed Education provided;Falls prevention discussed - - -     FALL RISK PREVENTION PERTAINING TO THE HOME:  Any stairs in or around the home? Yes  If so, are there any without handrails? No  Home free of loose throw rugs in walkways, pet beds, electrical cords, etc? Yes  Adequate lighting in your home to reduce risk of falls? Yes   ASSISTIVE DEVICES UTILIZED TO PREVENT FALLS:  Life alert? No  Use of a cane, walker or w/c? No  Grab bars in the bathroom? Yes  Shower chair or bench in shower? Yes  Elevated toilet seat or a handicapped toilet? No   TIMED UP AND GO:  Was the test performed? Yes .  Length of time to ambulate 10 feet: 10 sec.   Gait steady and fast without use of assistive device  Cognitive Function:        Immunizations Immunization History  Administered Date(s) Administered   Influenza Split 02/15/2018   Influenza, High Dose Seasonal PF 04/10/2015   Influenza-Unspecified 02/01/2013, 03/21/2014, 02/10/2016   PFIZER(Purple Top)SARS-COV-2 Vaccination 07/16/2019, 08/15/2019   Pneumococcal Conjugate-13 11/12/2014   Pneumococcal Polysaccharide-23 12/25/2015   Tdap 10/26/2012   Zoster Recombinat (Shingrix) 09/04/2016   Zoster, Live 03/21/2013    TDAP status: Up to date  Flu Vaccine status: Up to date  Pneumococcal vaccine status: Up to date  Covid-19 vaccine status: Information provided on how to obtain vaccines. Booster due  Qualifies for Shingles Vaccine? No   Zostavax completed Yes   Shingrix Completed?: Yes  Screening Tests Health Maintenance  Topic Date Due   Zoster Vaccines- Shingrix (2 of 2) 10/30/2016   COVID-19 Vaccine (3 - Booster for Pfizer series) 01/15/2020   INFLUENZA VACCINE  12/16/2020   TETANUS/TDAP  10/27/2022   PNA vac Low Risk Adult  Completed   HPV VACCINES  Aged Out    Health Maintenance  Health Maintenance Due  Topic Date Due   Zoster Vaccines- Shingrix (2 of 2) 10/30/2016   COVID-19 Vaccine (3 - Booster for Pfizer series) 01/15/2020    Colorectal cancer screening: No  longer required.   Lung Cancer Screening: (Low Dose CT Chest recommended if Age 45-80 years, 30 pack-year currently smoking OR have quit w/in 15years.) does not qualify.     Additional Screening:  Hepatitis C Screening: does not qualify  Vision Screening: Recommended annual ophthalmology exams for early detection of glaucoma and other disorders of the eye. Is the patient up to date with their annual eye exam?  Yes  Who is the provider or what is the name of the office in which the  Dental Screening: Recommended annual dental exams for proper oral hygiene  Community Resource Referral / Chronic Care Management: CRR required this visit?  Yes For caregiver assistance -He is his wife's caregiver   CCM required this visit?  No      Plan:     I have personally reviewed and noted the following in the patient's chart:   Medical and social history Use of alcohol, tobacco or illicit drugs  Current medications and supplements including opioid prescriptions. Patient is not currently taking opioid prescriptions. Functional ability and status Nutritional status Physical activity Advanced directives List of other physicians Hospitalizations, surgeries, and ER visits in previous 12 months Vitals Screenings to include cognitive, depression, and falls Referrals and appointments  In addition, I have reviewed and discussed with patient certain preventive protocols, quality metrics, and best practice recommendations. A written personalized care plan for preventive services as well as general preventive health recommendations were provided to patient.      Roanna Raider, LPN   09/11/8339  Nurse Health Advisor  Nurse Notes: None

## 2020-12-05 NOTE — Patient Instructions (Signed)
Roger Williams , Thank you for taking time to come for your Medicare Wellness Visit. I appreciate your ongoing commitment to your health goals. Please review the following plan we discussed and let me know if I can assist you in the future.   Screening recommendations/referrals: Colonoscopy: No longer required Recommended yearly ophthalmology/optometry visit for glaucoma screening and checkup Recommended yearly dental visit for hygiene and checkup  Vaccinations: Influenza vaccine: Due 01/2021 Pneumococcal vaccine: Up to date Tdap vaccine: Up to date-Due 10/27/2022 Shingles vaccine: Completed vaccines   Covid-19: Declined booster  Advanced directives: Please bring a copy for your chart  Conditions/risks identified: See problem list  Next appointment: Follow up in one year for your annual wellness visit.   Preventive Care 17 Years and Older, Male Preventive care refers to lifestyle choices and visits with your health care provider that can promote health and wellness. What does preventive care include? A yearly physical exam. This is also called an annual well check. Dental exams once or twice a year. Routine eye exams. Ask your health care provider how often you should have your eyes checked. Personal lifestyle choices, including: Daily care of your teeth and gums. Regular physical activity. Eating a healthy diet. Avoiding tobacco and drug use. Limiting alcohol use. Practicing safe sex. Taking low doses of aspirin every day. Taking vitamin and mineral supplements as recommended by your health care provider. What happens during an annual well check? The services and screenings done by your health care provider during your annual well check will depend on your age, overall health, lifestyle risk factors, and family history of disease. Counseling  Your health care provider may ask you questions about your: Alcohol use. Tobacco use. Drug use. Emotional well-being. Home and  relationship well-being. Sexual activity. Eating habits. History of falls. Memory and ability to understand (cognition). Work and work Astronomer. Screening  You may have the following tests or measurements: Height, weight, and BMI. Blood pressure. Lipid and cholesterol levels. These may be checked every 5 years, or more frequently if you are over 63 years old. Skin check. Lung cancer screening. You may have this screening every year starting at age 29 if you have a 30-pack-year history of smoking and currently smoke or have quit within the past 15 years. Fecal occult blood test (FOBT) of the stool. You may have this test every year starting at age 24. Flexible sigmoidoscopy or colonoscopy. You may have a sigmoidoscopy every 5 years or a colonoscopy every 10 years starting at age 25. Prostate cancer screening. Recommendations will vary depending on your family history and other risks. Hepatitis C blood test. Hepatitis B blood test. Sexually transmitted disease (STD) testing. Diabetes screening. This is done by checking your blood sugar (glucose) after you have not eaten for a while (fasting). You may have this done every 1-3 years. Abdominal aortic aneurysm (AAA) screening. You may need this if you are a current or former smoker. Osteoporosis. You may be screened starting at age 68 if you are at high risk. Talk with your health care provider about your test results, treatment options, and if necessary, the need for more tests. Vaccines  Your health care provider may recommend certain vaccines, such as: Influenza vaccine. This is recommended every year. Tetanus, diphtheria, and acellular pertussis (Tdap, Td) vaccine. You may need a Td booster every 10 years. Zoster vaccine. You may need this after age 42. Pneumococcal 13-valent conjugate (PCV13) vaccine. One dose is recommended after age 19. Pneumococcal polysaccharide (PPSV23) vaccine. One  dose is recommended after age 47. Talk to your  health care provider about which screenings and vaccines you need and how often you need them. This information is not intended to replace advice given to you by your health care provider. Make sure you discuss any questions you have with your health care provider. Document Released: 05/31/2015 Document Revised: 01/22/2016 Document Reviewed: 03/05/2015 Elsevier Interactive Patient Education  2017 [place] Prevention in the Home Falls can cause injuries. They can happen to people of all ages. There are many things you can do to make your home safe and to help prevent falls. What can I do on the outside of my home? Regularly fix the edges of walkways and driveways and fix any cracks. Remove anything that might make you trip as you walk through a door, such as a raised step or threshold. Trim any bushes or trees on the path to your home. Use bright outdoor lighting. Clear any walking paths of anything that might make someone trip, such as rocks or tools. Regularly check to see if handrails are loose or broken. Make sure that both sides of any steps have handrails. Any raised decks and porches should have guardrails on the edges. Have any leaves, snow, or ice cleared regularly. Use sand or salt on walking paths during winter. Clean up any spills in your garage right away. This includes oil or grease spills. What can I do in the bathroom? Use night lights. Install grab bars by the toilet and in the tub and shower. Do not use towel bars as grab bars. Use non-skid mats or decals in the tub or shower. If you need to sit down in the shower, use a plastic, non-slip stool. Keep the floor dry. Clean up any water that spills on the floor as soon as it happens. Remove soap buildup in the tub or shower regularly. Attach bath mats securely with double-sided non-slip rug tape. Do not have throw rugs and other things on the floor that can make you trip. What can I do in the bedroom? Use night  lights. Make sure that you have a light by your bed that is easy to reach. Do not use any sheets or blankets that are too big for your bed. They should not hang down onto the floor. Have a firm chair that has side arms. You can use this for support while you get dressed. Do not have throw rugs and other things on the floor that can make you trip. What can I do in the kitchen? Clean up any spills right away. Avoid walking on wet floors. Keep items that you use a lot in easy-to-reach places. If you need to reach something above you, use a strong step stool that has a grab bar. Keep electrical cords out of the way. Do not use floor polish or wax that makes floors slippery. If you must use wax, use non-skid floor wax. Do not have throw rugs and other things on the floor that can make you trip. What can I do with my stairs? Do not leave any items on the stairs. Make sure that there are handrails on both sides of the stairs and use them. Fix handrails that are broken or loose. Make sure that handrails are as long as the stairways. Check any carpeting to make sure that it is firmly attached to the stairs. Fix any carpet that is loose or worn. Avoid having throw rugs at the top or bottom of the  stairs. If you do have throw rugs, attach them to the floor with carpet tape. Make sure that you have a light switch at the top of the stairs and the bottom of the stairs. If you do not have them, ask someone to add them for you. What else can I do to help prevent falls? Wear shoes that: Do not have high heels. Have rubber bottoms. Are comfortable and fit you well. Are closed at the toe. Do not wear sandals. If you use a stepladder: Make sure that it is fully opened. Do not climb a closed stepladder. Make sure that both sides of the stepladder are locked into place. Ask someone to hold it for you, if possible. Clearly mark and make sure that you can see: Any grab bars or handrails. First and last  steps. Where the edge of each step is. Use tools that help you move around (mobility aids) if they are needed. These include: Canes. Walkers. Scooters. Crutches. Turn on the lights when you go into a dark area. Replace any light bulbs as soon as they burn out. Set up your furniture so you have a clear path. Avoid moving your furniture around. If any of your floors are uneven, fix them. If there are any pets around you, be aware of where they are. Review your medicines with your doctor. Some medicines can make you feel dizzy. This can increase your chance of falling. Ask your doctor what other things that you can do to help prevent falls. This information is not intended to replace advice given to you by your health care provider. Make sure you discuss any questions you have with your health care provider. Document Released: 02/28/2009 Document Revised: 10/10/2015 Document Reviewed: 06/08/2014 Elsevier Interactive Patient Education  2017 [employer].

## 2020-12-11 ENCOUNTER — Telehealth: Payer: Self-pay

## 2020-12-11 NOTE — Telephone Encounter (Signed)
   Telephone encounter was:  Successful.  12/11/2020 Name: BARTHOLOMEW RAMESH MRN: [id number] DOB: 10/15/1940  Marlana Salvage Shimabukuro is a 80 y.o. year old male who is a primary care patient of Saguier, Ramon Dred[place], [place] . The community resource team was consulted for assistance with  dementia care resources.  Care guide performed the following interventions: Patient provided with information about care guide support team and interviewed to confirm resource needs.  Follow Up Plan:  Spoke with patient confirmed email address [email or web address] sent information for [facility] and [facility] [place]. Patient received email.  Ereka Brau, AAS Paralegal, [facility] Care Guide  Embedded Care Coordination [place]  Care Management  [address] [place], [place] [address] ??[email or web address]  ?? [phone]   [email or web address]

## 2021-02-03 ENCOUNTER — Telehealth: Payer: Self-pay | Admitting: Medical

## 2021-02-07 ENCOUNTER — Telehealth: Payer: Self-pay | Admitting: *Deleted

## 2021-02-07 ENCOUNTER — Ambulatory Visit: Payer: Medicare Other | Admitting: Family

## 2021-02-07 NOTE — Telephone Encounter (Signed)
Patient appointment was for depression so I called to see if he would like to reschedule and he declined.  He will call back next week to schedule.

## 2021-02-07 NOTE — Telephone Encounter (Signed)
ontact Type Call Who Is Calling Patient / Member / Family / Caregiver Caller Name Macy Lingenfelter Caller Phone Number [phone] Patient Name Roger Williams Patient DOB [birth year] Call Type Message Only Information Provided Reason for Call Request to [facility] Appointment Initial Comment Caller states he needs to cancel an appointment. Patient request to speak to RN No Disp. Time Disposition Final User 02/06/2021 9:26:38 PM General Information Provided Yes Maurice March, [facility]

## 2021-02-19 DIAGNOSIS — R52 Pain, unspecified: Secondary | ICD-10-CM | POA: Diagnosis not present

## 2021-02-19 DIAGNOSIS — M1711 Unilateral primary osteoarthritis, right knee: Secondary | ICD-10-CM | POA: Diagnosis not present

## 2021-03-12 NOTE — Telephone Encounter (Signed)
Error

## 2021-06-09 ENCOUNTER — Telehealth: Payer: Self-pay | Admitting: Medical

## 2021-06-09 NOTE — Telephone Encounter (Signed)
[place] calls came to see pt today. Abigail Butts stated his bp was 160/80, rechecked and it was 182/88. She asked pt to start bp log to bring into dr office. She also did a circulation screening and his right foot had a decrease in circulation. Also pulse was weaker on that side. Please advise.

## 2021-06-09 NOTE — Telephone Encounter (Signed)
FYI :  Pt scheduled for tomorrow for 4pm

## 2021-06-10 ENCOUNTER — Ambulatory Visit (INDEPENDENT_AMBULATORY_CARE_PROVIDER_SITE_OTHER): Payer: Medicare Other | Admitting: Medical

## 2021-06-10 VITALS — BP 155/74 | HR 56 | Resp 18 | Ht 70.0 in | Wt 175.2 lb

## 2021-06-10 DIAGNOSIS — E785 Hyperlipidemia, unspecified: Secondary | ICD-10-CM | POA: Diagnosis not present

## 2021-06-10 DIAGNOSIS — I10 Essential (primary) hypertension: Secondary | ICD-10-CM

## 2021-06-10 DIAGNOSIS — I999 Unspecified disorder of circulatory system: Secondary | ICD-10-CM | POA: Diagnosis not present

## 2021-06-10 MED ORDER — LOSARTAN POTASSIUM 25 MG PO TABS: 25.0000 mg | ORAL_TABLET | Freq: Every day | ORAL | 0 refills | Status: DC

## 2021-06-10 NOTE — Progress Notes (Signed)
Subjective:    [name], male    DOB: January 03, 1941, 81 y.o.   MRN: [id number]  HPI Pt in for follow up.  He is here per advisement from nurse at [facility]. 2 reading yesterday were 160/80 and 182/88. No cardiac or neurologic signs/symptoms. [facility]  Pt does have a blood pressure machine. New machine.  Pt is exercising daily. Pt has sheet from [facility] which showed rt leg blood flow decreased. Pt reported no pain in lower ext and no claudication type syptoms. He declines work up or this presently.    Review of Systems  Constitutional:  Negative for chills, fatigue and fever.  HENT:  Negative for congestion.   Respiratory:  Negative for cough, chest tightness, shortness of breath and wheezing.   Cardiovascular:  Negative for chest pain and palpitations.  Gastrointestinal:  Negative for anal bleeding.  Genitourinary:  Negative for dysuria.  Musculoskeletal:  Negative for back pain and myalgias.  Skin:  Negative for rash.  Neurological:  Negative for dizziness, seizures, syncope, weakness and headaches.  Hematological:  Negative for adenopathy. Does not bruise/bleed easily.  Psychiatric/Behavioral:  Negative for behavioral problems, confusion and suicidal ideas. The patient is not nervous/anxious.    No past medical history on file.   Social History   Socioeconomic History   Marital status: Married    Spouse name: Not on file   Number of children: Not on file   Years of education: Not on file   Highest education level: Not on file  Occupational History   Not on file  Tobacco Use   Smoking status: Never   Smokeless tobacco: Never  Vaping Use   Vaping Use: Never used  Substance and Sexual Activity   Alcohol use: No   Drug use: No   Sexual activity: Yes  Other Topics Concern   Not on file  Social History Narrative   Not on file   Social Determinants of Health   Financial Resource Strain: Low Risk     Difficulty of Paying Living Expenses: Not hard at all  Food Insecurity: No Food Insecurity   Worried About Programme researcher, broadcasting/film/video in the Last Year: Never true   Ran Out of Food in the Last Year: Never true  Transportation Needs: No Transportation Needs   Lack of Transportation (Medical): No   Lack of Transportation (Non-Medical): No  Physical Activity: Insufficiently Active   Days of Exercise per Week: 3 days   Minutes of Exercise per Session: 30 min  Stress: Stress Concern Present   Feeling of Stress : To some extent  Social Connections: Moderately Integrated   Frequency of Communication with Friends and Family: More than three times a week   Frequency of Social Gatherings with Friends and Family: More than three times a week   Attends Religious Services: More than 4 times per year   Active Member of [employer] or Organizations: No   Attends Banker Meetings: Never   Marital Status: Married  Catering manager Violence: Not At Risk   Fear of Current or Ex-Partner: No   Emotionally Abused: No   Physically Abused: No   Sexually Abused: No    Past Surgical History:  Procedure Laterality Date   EYE SURGERY Right 11/23/2019   cataract   HERNIA REPAIR     JOINT REPLACEMENT     PROSTATE SURGERY     TONSILLECTOMY      Family  History  Problem Relation Age of Onset   Parkinson's disease Father    Parkinson's disease Brother     No Known Allergies  Current Outpatient Medications on File Prior to Visit  Medication Sig Dispense Refill   aspirin 81 MG tablet Take 81 mg by mouth daily.     diclofenac Sodium (VOLTAREN) 1 % GEL Apply 4 g topically 4 (four) times daily. To affected joint. 100 g 11   Multiple Vitamin (MULTIVITAMIN) tablet Take 1 tablet by mouth daily.     Ranitidine HCl (ACID CONTROL PO) Take 50 mg by mouth daily. OTC Acutate     No current facility-administered medications on file prior to visit.    BP (!) 155/74    Pulse (!) 56    Resp 18    Ht 5\' 10"  (1.778  m)    Wt 175 lb 3.2 oz (79.5 kg)    SpO2 99%    BMI 25.14 kg/m        Objective:   Physical Exam  General Mental Status- Alert. General Appearance- Not in acute distress.    Chest and Lung Exam Auscultation: Breath Sounds:-Normal.  Cardiovascular Auscultation:Rythm- Regular. Murmurs & Other Heart Sounds:Auscultation of the heart reveals- No Murmurs.   Neurologic Cranial Nerve exam:- CN III-XII intact(No nystagmus), symmetric smile. Strength:- 5/5 equal and symmetric strength both upper and lower extremities.       Assessment & Plan:   Patient Instructions  Htn- bp elevated in past and recently as well. Advised best to start bp medication losartan 25 mg and check bp daily. Hoping will gradually decrease to goal of close to 130/80. If not decreasing then would recommend increasing to 50 mg if needed.   If any cardiac or neurologic signs/symptoms occur then recommend ED evaluation.  Placed future lab to get cmp and lipid panel  Can discuss further circulation screening finding on follow up. Brief discussion today as you had other appointment to get to.  Follow up in one month or sooner if needed.   , PA-C   Time spent with patient today was  32 minutes which consisted of chart revdiew, discussing diagnosis, work up treatment and documentation.

## 2021-06-10 NOTE — Patient Instructions (Addendum)
Htn- bp elevated in past and recently as well. Advised best to start bp medication losartan 25 mg and check bp daily. Hoping will gradually decrease to goal of close to 130/80. If not decreasing then would recommend increasing to 50 mg if needed.   If any cardiac or neurologic signs/symptoms occur then recommend ED evaluation.  Placed future lab to get cmp and lipid panel  Can discuss further circulation screening finding on follow up. Brief discussion today as you had other appointment to get to. Not immediate concern as no claudication reported  Follow up in one month or sooner if needed.

## 2021-06-10 NOTE — Telephone Encounter (Signed)
When I spoke with patient yesterday while making appointment he had no complaints  Called pt this AM to check on him , no answer and unable to lvm

## 2021-06-19 ENCOUNTER — Other Ambulatory Visit (INDEPENDENT_AMBULATORY_CARE_PROVIDER_SITE_OTHER): Payer: Medicare Other

## 2021-06-19 ENCOUNTER — Telehealth: Payer: Self-pay | Admitting: Medical

## 2021-06-19 DIAGNOSIS — I10 Essential (primary) hypertension: Secondary | ICD-10-CM

## 2021-06-19 DIAGNOSIS — E785 Hyperlipidemia, unspecified: Secondary | ICD-10-CM

## 2021-06-19 LAB — COMPREHENSIVE METABOLIC PANEL
ALT: 22 U/L (ref 0–53)
AST: 27 U/L (ref 0–37)
Albumin: 4.1 g/dL (ref 3.5–5.2)
Alkaline Phosphatase: 61 U/L (ref 39–117)
BUN: 16 mg/dL (ref 6–23)
CO2: 31 mEq/L (ref 19–32)
Calcium: 8.9 mg/dL (ref 8.4–10.5)
Chloride: 105 mEq/L (ref 96–112)
Creatinine, Ser: 0.97 mg/dL (ref 0.40–1.50)
GFR: 73.29 mL/min (ref >60.00)
Glucose, Bld: 103 mg/dL — ABNORMAL HIGH (ref 70–99)
Potassium: 4.1 mEq/L (ref 3.5–5.1)
Sodium: 141 mEq/L (ref 135–145)
Total Bilirubin: 0.7 mg/dL (ref 0.2–1.2)
Total Protein: 6.2 g/dL (ref 6.0–8.3)

## 2021-06-19 LAB — LIPID PANEL
Cholesterol: 161 mg/dL (ref 0–200)
HDL: 42.4 mg/dL (ref >39.00)
LDL Cholesterol: 96 mg/dL (ref 0–99)
NonHDL: 118.15
Total CHOL/HDL Ratio: 4
Triglycerides: 111 mg/dL (ref 0.0–149.0)
VLDL: 22.2 mg/dL (ref 0.0–40.0)

## 2021-06-19 MED ORDER — LOSARTAN POTASSIUM 50 MG PO TABS: 50.0000 mg | ORAL_TABLET | Freq: Every day | ORAL | 0 refills | Status: DC

## 2021-06-19 NOTE — Telephone Encounter (Signed)
Increase losartan to 50 mg daily. Bp levels recently moderate high.  Mackie Pai, PA-C

## 2021-06-19 NOTE — Telephone Encounter (Signed)
Pt came in office wanting to give BP reading to provider from January 26 163/80, January 29 155/75, February 1 155/72. Please advise if needed.

## 2021-06-20 NOTE — Telephone Encounter (Signed)
Pt called and unable to lvm to return call

## 2021-06-23 MED ORDER — LOSARTAN POTASSIUM 25 MG PO TABS: 25.0000 mg | ORAL_TABLET | Freq: Every day | ORAL | 3 refills | Status: DC

## 2021-06-23 NOTE — Telephone Encounter (Signed)
Called patient's cell phone , spoke with him stated he has never taken the 25 mg before , advised edward he stated just for pt to start on 25 mg instead of 50 mg , 25 mg sent it in and [place] Bp check scheduled

## 2021-06-30 ENCOUNTER — Telehealth: Payer: Self-pay | Admitting: *Deleted

## 2021-06-30 NOTE — Telephone Encounter (Signed)
Contact Type Call Who Is Calling Patient / Member / Family / Caregiver Caller Name Hakeem Frazzini Phone Number [phone] Patient Name Roger Williams Patient DOB [birth year] Call Type Message Only Information Provided Reason for Call Request to Reschedule Office Appointment Initial Comment Caller states needs to cancel 9am appt for Tuesday; will be out of town to a funeral. Patient request to speak to RN No Additional Comment Caller declined triage. Will call back to reschedule.

## 2021-06-30 NOTE — Telephone Encounter (Signed)
Appt cancelled, tried to call to reschedule but no answer and vm not set up.

## 2021-07-01 ENCOUNTER — Ambulatory Visit: Payer: Medicare Other

## 2021-10-07 ENCOUNTER — Ambulatory Visit (INDEPENDENT_AMBULATORY_CARE_PROVIDER_SITE_OTHER): Payer: Medicare Other | Admitting: Medical

## 2021-10-07 ENCOUNTER — Encounter: Payer: Self-pay | Admitting: Medical

## 2021-10-07 VITALS — BP 120/68 | HR 60 | Resp 18 | Ht 70.0 in | Wt 177.0 lb

## 2021-10-07 DIAGNOSIS — R739 Hyperglycemia, unspecified: Secondary | ICD-10-CM | POA: Diagnosis not present

## 2021-10-07 DIAGNOSIS — R002 Palpitations: Secondary | ICD-10-CM

## 2021-10-07 DIAGNOSIS — I10 Essential (primary) hypertension: Secondary | ICD-10-CM

## 2021-10-07 DIAGNOSIS — E785 Hyperlipidemia, unspecified: Secondary | ICD-10-CM | POA: Diagnosis not present

## 2021-10-07 NOTE — Patient Instructions (Addendum)
Hypertension-your blood pressure readings from February to May were elevated most of the time in the 150-160 systolic range.  However today when my medical assistant check your blood pressure was in good range.  When I checked it was even better at 120/80.  Based on these levels I am going to give you the prescription of losartan 25 mg and the instruction will be 1 to 2 tablets daily.  Want you to check your blood pressures daily and if your blood your blood pressure is trending over 140 systolic then take 50 mg daily.  If less than 140 just take 25 mg daily.  Try to eliminate caffeine completely as this might be 1 reason for increased blood pressure.  Also you do report intermittent rare brief palpitation events lasting seconds only.  No associated chest pain or other cardiac type signs and symptoms.  Would recommend also for this eliminate caffeine completely.  If you do have recurrent palpitation event even if only brief let me know and in that event we will go ahead and refer you to cardiologist.  If you were to have sustained palpitation event then recommend ED evaluation.  Ekg today. Nsr but bradycarida. 2 ekg done and appears artifact movment. On walking pulse increased to 63. Pt walks a lot daily.  Elevated sugar in the past.  We will get metabolic panel and A1c.  Most recent cholesterol was well controlled.  Do not fasting presently so not getting lipid panel today.  Follow-up in 1 month or sooner if needed.

## 2021-10-07 NOTE — Progress Notes (Signed)
Subjective:    [name], male    DOB: April 19, 1941, 81 y.o.   MRN: [id number]  HPI  Pt in for follow up. On last visit A/P advise in "  "Htn- bp elevated in past and recently as well. Advised best to start bp medication losartan 25 mg and check bp daily. Hoping will gradually decrease to goal of close to 130/80. If not decreasing then would recommend increasing to 50 mg if needed. "  He was checking increasing his bp to 50 mg periodically but not checking the impact of higher level on regular basis.  Pt states also he gets some occasional palipitations. He quit coffee in am but still drinks occasional Dr. Malachi Bonds or Coke. Will last for seconds randomly. Will last for 2-3 seconds. But he can't give exact number of times this has occurred.  Pt not sure when last event of palpitation.  Non smoker.  From februrary to may his bp was most of the time in the [id number] systolic range/diastolic controlled.    Review of Systems  Constitutional:  Negative for chills, fatigue and fever.  HENT:  Negative for congestion.   Respiratory:  Negative for cough, chest tightness, shortness of breath and wheezing.   Cardiovascular:  Negative for chest pain and palpitations.       See hpi occasional palpatatoin but none presently.  Gastrointestinal:  Negative for abdominal pain.  Musculoskeletal:  Negative for back pain.  Skin:  Negative for color change and rash.  Neurological:  Negative for dizziness, syncope and numbness.  Hematological:  Negative for adenopathy. Does not bruise/bleed easily.  Psychiatric/Behavioral:  Negative for confusion and dysphoric mood.     No past medical history on file.   Social History   Socioeconomic History   Marital status: Married    Spouse name: Not on file   Number of children: Not on file   Years of education: Not on file   Highest education level: Not on file  Occupational History   Not on file  Tobacco Use   Smoking status: Never    Smokeless tobacco: Never  Vaping Use   Vaping Use: Never used  Substance and Sexual Activity   Alcohol use: No   Drug use: No   Sexual activity: Yes  Other Topics Concern   Not on file  Social History Narrative   Not on file   Social Determinants of Health   Financial Resource Strain: Low Risk    Difficulty of Paying Living Expenses: Not hard at all  Food Insecurity: No Food Insecurity   Worried About Charity fundraiser in the Last Year: Never true   [place] in the Last Year: Never true  Transportation Needs: No Transportation Needs   Lack of Transportation (Medical): No   Lack of Transportation (Non-Medical): No  Physical Activity: Insufficiently Active   Days of Exercise per Week: 3 days   Minutes of Exercise per Session: 30 min  Stress: Stress Concern Present   Feeling of Stress : To some extent  Social Connections: Moderately Integrated   Frequency of Communication with Friends and Family: More than three times a week   Frequency of Social Gatherings with Friends and Family: More than three times a week   Attends Religious Services: More than 4 times per year   Active Member of [employer] or Organizations: No   Attends Archivist Meetings: Never   Marital Status: Married  Human resources officer Violence: Not At  Risk   Fear of Current or Ex-Partner: No   Emotionally Abused: No   Physically Abused: No   Sexually Abused: No    Past Surgical History:  Procedure Laterality Date   EYE SURGERY Right 11/23/2019   cataract   HERNIA REPAIR     JOINT REPLACEMENT     PROSTATE SURGERY     TONSILLECTOMY      Family History  Problem Relation Age of Onset   Parkinson's disease Father    Parkinson's disease Brother     No Known Allergies  Current Outpatient Medications on File Prior to Visit  Medication Sig Dispense Refill   aspirin 81 MG tablet Take 81 mg by mouth daily.     diclofenac Sodium (VOLTAREN) 1 % GEL Apply 4 g topically 4 (four) times daily. To  affected joint. 100 g 11   Multiple Vitamin (MULTIVITAMIN) tablet Take 1 tablet by mouth daily.     Ranitidine HCl (ACID CONTROL PO) Take 50 mg by mouth daily. OTC Acutate     losartan (COZAAR) 25 MG tablet Take 1 tablet (25 mg total) by mouth daily. 90 tablet 3   No current facility-administered medications on file prior to visit.    BP 136/60   Pulse 60   Resp 18   Ht 5\' 10"  (1.778 m)   Wt 177 lb (80.3 kg)   SpO2 97%   BMI 25.40 kg/m       Objective:   Physical Exam  General- No acute distress. Pleasant patient. Neck- Full range of motion, no jvd Lungs- Clear, even and unlabored. Heart- regular rate and rhythm. Neurologic- CNII- XII grossly intact.       Assessment & Plan:   Patient Instructions  Hypertension-your blood pressure readings from February to May were elevated most of the time in the [id number] systolic range.  However today when my medical assistant check your blood pressure was in good range.  When I checked it was even better at 120/80.  Based on these levels I am going to give you the prescription of losartan 25 mg and the instruction will be 1 to 2 tablets daily.  Want you to check your blood pressures daily and if your blood your blood pressure is trending over [id number] systolic then take 50 mg daily.  If less than 140 just take 25 mg daily.  Try to eliminate caffeine completely as this might be 1 reason for increased blood pressure.  Also you do report intermittent rare brief palpitation events lasting seconds only.  No associated chest pain or other cardiac type signs and symptoms.  Would recommend also for this eliminate caffeine completely.  If you do have recurrent palpitation event even if only brief let me know and in that event we will go ahead and refer you to cardiologist.  If you were to have sustained palpitation event then recommend ED evaluation.  Elevated sugar in the past.  We will get metabolic panel and [id number].  Most recent cholesterol was well  controlled.  Do not fasting presently so not getting lipid panel today.  Follow-up in 1 month or sooner if needed.   Mackie Pai, PA-C

## 2021-10-08 LAB — COMPREHENSIVE METABOLIC PANEL
ALT: 20 U/L (ref 0–53)
AST: 25 U/L (ref 0–37)
Albumin: 4.5 g/dL (ref 3.5–5.2)
Alkaline Phosphatase: 69 U/L (ref 39–117)
BUN: 21 mg/dL (ref 6–23)
CO2: 27 mEq/L (ref 19–32)
Calcium: 9.2 mg/dL (ref 8.4–10.5)
Chloride: 104 mEq/L (ref 96–112)
Creatinine, Ser: 0.99 mg/dL (ref 0.40–1.50)
GFR: 71.37 mL/min (ref >60.00)
Glucose, Bld: 86 mg/dL (ref 70–99)
Potassium: 4.5 mEq/L (ref 3.5–5.1)
Sodium: 140 mEq/L (ref 135–145)
Total Bilirubin: 0.9 mg/dL (ref 0.2–1.2)
Total Protein: 6.5 g/dL (ref 6.0–8.3)

## 2021-10-08 LAB — HEMOGLOBIN A1C: Hgb A1c MFr Bld: 5.6 % (ref 4.6–6.5)

## 2021-11-11 ENCOUNTER — Ambulatory Visit (INDEPENDENT_AMBULATORY_CARE_PROVIDER_SITE_OTHER): Payer: Medicare Other | Admitting: Medical

## 2021-11-11 VITALS — BP 132/59 | HR 69 | Temp 98.4°F | Resp 18 | Ht 70.0 in | Wt 178.0 lb

## 2021-11-11 DIAGNOSIS — I10 Essential (primary) hypertension: Secondary | ICD-10-CM

## 2021-11-11 DIAGNOSIS — R002 Palpitations: Secondary | ICD-10-CM

## 2021-11-11 NOTE — Progress Notes (Signed)
Subjective:    [name], male    DOB: 1940-10-14, 81 y.o.   MRN: [id number]  HPI Pt in for follow up.  Bp is well controlled today. He is only taking losartan 25 mg daily.  Pt had palpitations on last visit. He states pretty much has resolved. He states only had one second palpitation this am. He states did not stop when he cut ouf caffeine. Pt indicates he does not think necessary to see cardiologist.  When he checks his blood pressure 40-50. 45 on average.  Pt continue to not use caffeine.         Review of Systems  Constitutional:  Negative for chills, fatigue and fever.  Eyes:  Negative for photophobia, redness and itching.  Respiratory:  Negative for cough, chest tightness, shortness of breath and wheezing.   Cardiovascular:  Positive for palpitations. Negative for chest pain.       Brief one second palpitatoin this morning.  Gastrointestinal:  Negative for abdominal pain.  Genitourinary:  Negative for dysuria and frequency.  Musculoskeletal:  Negative for back pain and gait problem.  Neurological:  Negative for dizziness and headaches.  Hematological:  Negative for adenopathy. Does not bruise/bleed easily.  Psychiatric/Behavioral:  Negative for behavioral problems and confusion.     No past medical history on file.   Social History   Socioeconomic History   Marital status: Married    Spouse name: Not on file   Number of children: Not on file   Years of education: Not on file   Highest education level: Not on file  Occupational History   Not on file  Tobacco Use   Smoking status: Never   Smokeless tobacco: Never  Vaping Use   Vaping Use: Never used  Substance and Sexual Activity   Alcohol use: No   Drug use: No   Sexual activity: Yes  Other Topics Concern   Not on file  Social History Narrative   Not on file   Social Determinants of Health   Financial Resource Strain: Low Risk  (12/05/2020)   Overall Financial Resource Strain  (CARDIA)    Difficulty of Paying Living Expenses: Not hard at all  Food Insecurity: No Food Insecurity (12/05/2020)   Hunger Vital Sign    Worried About Running Out of Food in the Last Year: Never true    Ran Out of Food in the Last Year: Never true  Transportation Needs: No Transportation Needs (12/05/2020)   PRAPARE - Administrator, Civil Service (Medical): No    Lack of Transportation (Non-Medical): No  Physical Activity: Insufficiently Active (12/05/2020)   Exercise Vital Sign    Days of Exercise per Week: 3 days    Minutes of Exercise per Session: 30 min  Stress: Stress Concern Present (12/05/2020)   [employer] of Occupational Health - Occupational Stress Questionnaire    Feeling of Stress : To some extent  Social Connections: Moderately Integrated (12/05/2020)   Social Connection and Isolation Panel [NHANES]    Frequency of Communication with Friends and Family: More than three times a week    Frequency of Social Gatherings with Friends and Family: More than three times a week    Attends Religious Services: More than 4 times per year    Active Member of [employer] or Organizations: No    Attends Banker Meetings: Never    Marital Status: Married  Catering manager Violence: Not At Risk (12/05/2020)   Humiliation, Afraid,  Rape, and Kick questionnaire    Fear of Current or Ex-Partner: No    Emotionally Abused: No    Physically Abused: No    Sexually Abused: No    Past Surgical History:  Procedure Laterality Date   EYE SURGERY Right 11/23/2019   cataract   HERNIA REPAIR     JOINT REPLACEMENT     PROSTATE SURGERY     TONSILLECTOMY      Family History  Problem Relation Age of Onset   Parkinson's disease Father    Parkinson's disease Brother     No Known Allergies  Current Outpatient Medications on File Prior to Visit  Medication Sig Dispense Refill   aspirin 81 MG tablet Take 81 mg by mouth daily.     diclofenac Sodium (VOLTAREN) 1 % GEL  Apply 4 g topically 4 (four) times daily. To affected joint. 100 g 11   Multiple Vitamin (MULTIVITAMIN) tablet Take 1 tablet by mouth daily.     Ranitidine HCl (ACID CONTROL PO) Take 50 mg by mouth daily. OTC Acutate     losartan (COZAAR) 25 MG tablet Take 1 tablet (25 mg total) by mouth daily. 90 tablet 3   No current facility-administered medications on file prior to visit.    BP (!) 132/59   Pulse 69   Temp 98.4 F (36.9 C)   Resp 18   Ht 5\' 10"  (1.778 m)   Wt 178 lb (80.7 kg)   SpO2 96%   BMI 25.54 kg/m        Objective:   Physical Exam  General Mental Status- Alert. General Appearance- Not in acute distress.   Skin General: Color- Normal Color. Moisture- Normal Moisture.  Neck Carotid Arteries- Normal color. Moisture- Normal Moisture. No carotid bruits. No JVD.  Chest and Lung Exam Auscultation: Breath Sounds:-Normal.  Cardiovascular Auscultation:Rythm- Regular. Murmurs & Other Heart Sounds:Auscultation of the heart reveals- No Murmurs.  Abdomen Inspection:-Inspeection Normal. Palpation/Percussion:Note:No mass. Palpation and Percussion of the abdomen reveal- Non Tender, Non Distended + BS, no rebound or guarding.   Neurologic Cranial Nerve exam:- CN III-XII intact(No nystagmus), symmetric smile. Strength:- 5/5 equal and symmetric strength both upper and lower extremities.       Assessment & Plan:   Patient Instructions  Htn- bp well controlled with losartan 25 mg daily.  Very rare one second palpation. Discussed referral to cardiologist. Presently declining referral. Discussed how will follow. Still avoid caffeine. Use pulse ox/ rate.   Discussed pulse number to watch for.   If frequent palpitation let me know. If severe or prolonged palpitation be seen in ED.  Follow 4-6 months or sooner     [employer]

## 2022-01-16 ENCOUNTER — Ambulatory Visit (INDEPENDENT_AMBULATORY_CARE_PROVIDER_SITE_OTHER): Payer: Medicare Other

## 2022-01-16 VITALS — BP 149/76 | HR 52 | Temp 97.9°F | Resp 16 | Ht 70.0 in | Wt 183.2 lb

## 2022-01-16 DIAGNOSIS — Z0000 Encounter for general adult medical examination without abnormal findings: Secondary | ICD-10-CM | POA: Diagnosis not present

## 2022-01-16 NOTE — Progress Notes (Addendum)
Subjective:   Roger Williams is a 81 y.o. male who presents for Medicare Annual/Subsequent preventive examination.  Review of Systems     Cardiac Risk Factors include: advanced age (>42men, >72 women);male gender     Objective:    Today's Vitals   01/16/22 0817  BP: (!) 149/76  Pulse: (!) 52  Resp: 16  Temp: 97.9 F (36.6 C)  SpO2: 96%  Weight: 183 lb 3.2 oz (83.1 kg)  Height: 5\' 10"  (1.778 m)   Body mass index is 26.29 kg/m.     01/16/2022    8:19 AM 12/05/2020    1:37 PM 12/05/2019    1:07 PM 07/26/2019    7:59 AM 04/30/2019    2:16 PM 06/03/2018    8:11 AM  Advanced Directives  Does Patient Have a Medical Advance Directive? Yes Yes Yes Yes Yes Yes  Type of 06/05/2018 of [place];Living will Healthcare Power of [place];Living will Living will  Living will Healthcare Power of [place];Living will  Does patient want to make changes to medical advance directive? No - Patient declined  No - Patient declined No - Patient declined No - Patient declined   Copy of Healthcare Power of Attorney in Chart?  No - copy requested    No - copy requested    Current Medications (verified) Outpatient Encounter Medications as of 01/16/2022  Medication Sig   aspirin 81 MG tablet Take 81 mg by mouth daily.   diclofenac Sodium (VOLTAREN) 1 % GEL Apply 4 g topically 4 (four) times daily. To affected joint.   Multiple Vitamin (MULTIVITAMIN) tablet Take 1 tablet by mouth daily.   losartan (COZAAR) 25 MG tablet Take 1 tablet (25 mg total) by mouth daily.   [DISCONTINUED] Ranitidine HCl (ACID CONTROL PO) Take 50 mg by mouth daily. OTC Acutate   No facility-administered encounter medications on file as of 01/16/2022.    Allergies (verified) Patient has no known allergies.   History: History reviewed. No pertinent past medical history. Past Surgical History:  Procedure Laterality Date   EYE SURGERY Right 11/23/2019   cataract   HERNIA REPAIR     JOINT REPLACEMENT      PROSTATE SURGERY     TONSILLECTOMY     Family History  Problem Relation Age of Onset   Parkinson's disease Father    Parkinson's disease Brother    Social History   Socioeconomic History   Marital status: Married    Spouse name: Not on file   Number of children: Not on file   Years of education: Not on file   Highest education level: Not on file  Occupational History   Not on file  Tobacco Use   Smoking status: Never   Smokeless tobacco: Never  Vaping Use   Vaping Use: Never used  Substance and Sexual Activity   Alcohol use: No   Drug use: No   Sexual activity: Yes  Other Topics Concern   Not on file  Social History Narrative   Not on file   Social Determinants of Health   Financial Resource Strain: Low Risk  (12/05/2020)   Overall Financial Resource Strain (CARDIA)    Difficulty of Paying Living Expenses: Not hard at all  Food Insecurity: No Food Insecurity (12/05/2020)   Hunger Vital Sign    Worried About Running Out of Food in the Last Year: Never true    Ran Out of Food in the Last Year: Never true  Transportation Needs: No Transportation  Needs (12/05/2020)   PRAPARE - Administrator, Civil Service (Medical): No    Lack of Transportation (Non-Medical): No  Physical Activity: Insufficiently Active (12/05/2020)   Exercise Vital Sign    Days of Exercise per Week: 3 days    Minutes of Exercise per Session: 30 min  Stress: Stress Concern Present (12/05/2020)   [employer] of Occupational Health - Occupational Stress Questionnaire    Feeling of Stress : To some extent  Social Connections: Moderately Integrated (12/05/2020)   Social Connection and Isolation Panel [NHANES]    Frequency of Communication with Friends and Family: More than three times a week    Frequency of Social Gatherings with Friends and Family: More than three times a week    Attends Religious Services: More than 4 times per year    Active Member of [employer] or Organizations: No     Attends Engineer, structural: Never    Marital Status: Married    Tobacco Counseling Counseling given: Not Answered   Clinical Intake:  Pre-visit preparation completed: Yes  Pain : No/denies pain     BMI - recorded: 26.29 Nutritional Status: BMI 25 -29 Overweight Nutritional Risks: None Diabetes: No  How often do you need to have someone help you when you read instructions, pamphlets, or other written materials from your doctor or pharmacy?: 1 - Never  Diabetic?no  Interpreter Needed?: No  Information entered by :: Jimmi Sidener   Activities of Daily Living    01/16/2022    8:21 AM  In your present state of health, do you have any difficulty performing the following activities:  Hearing? 0  Vision? 0  Difficulty concentrating or making decisions? 0  Walking or climbing stairs? 0  Dressing or bathing? 0  Doing errands, shopping? 0  Preparing Food and eating ? N  Using the Toilet? N  In the past six months, have you accidently leaked urine? N  Do you have problems with loss of bowel control? N  Managing your Medications? N  Managing your Finances? N  Housekeeping or managing your Housekeeping? N    Patient Care Team: Saguier, Kateri Mc as PCP - General (Internal Medicine)  Indicate any recent Medical Services you may have received from other than [facility] providers in the past year (date may be approximate).     Assessment:   This is a routine wellness examination for Roger Williams.  Hearing/Vision screen No results found.  Dietary issues and exercise activities discussed: Current Exercise Habits: Home exercise routine, Type of exercise: walking, Time (Minutes): 45, Frequency (Times/Week): 7, Weekly Exercise (Minutes/Week): 315, Intensity: Mild   Goals Addressed             This Visit's Progress    Maintain healthy active lifestyle   On track      Depression Screen    01/16/2022    8:20 AM 12/05/2020    1:39 PM 12/05/2019    1:10 PM 06/03/2018     8:12 AM 12/25/2015    8:13 AM 11/12/2014    7:59 AM  PHQ 2/9 Scores  PHQ - 2 Score 0 1 0 0 0 0  PHQ- 9 Score     0     Fall Risk    01/16/2022    8:19 AM 12/05/2020    1:38 PM 12/05/2019    1:10 PM 06/03/2018    8:12 AM 12/25/2015    8:13 AM  Fall Risk   Falls in the past year? 0  0 0 0 No  Number falls in past yr: 0 0 0    Injury with Fall? 0 0 0    Risk for fall due to : No Fall Risks      Follow up Falls evaluation completed Falls prevention discussed Education provided;Falls prevention discussed      FALL RISK PREVENTION PERTAINING TO THE HOME:  Any stairs in or around the home? No  If so, are there any without handrails?  N/A Home free of loose throw rugs in walkways, pet beds, electrical cords, etc? Yes  Adequate lighting in your home to reduce risk of falls? Yes   ASSISTIVE DEVICES UTILIZED TO PREVENT FALLS:  Life alert? No  Use of a cane, walker or w/c? No  Grab bars in the bathroom? Yes  Shower chair or bench in shower? Yes  Elevated toilet seat or a handicapped toilet? No   TIMED UP AND GO:  Was the test performed? Yes .  Length of time to ambulate 10 feet: 10 sec.   Gait steady and fast without use of assistive device  Cognitive Function:        01/16/2022    8:23 AM  6CIT Screen  What Year? 0 points  What month? 0 points  What time? 0 points  Count back from 20 2 points  Months in reverse 0 points  Repeat phrase 0 points  Total Score 2 points    Immunizations Immunization History  Administered Date(s) Administered   Influenza Split 02/15/2018   Influenza, High Dose Seasonal PF 04/10/2015   Influenza-Unspecified 02/01/2013, 03/21/2014, 02/10/2016   PFIZER(Purple Top)SARS-COV-2 Vaccination 07/16/2019, 08/15/2019   Pneumococcal Conjugate-13 11/12/2014   Pneumococcal Polysaccharide-23 12/25/2015   Tdap 10/26/2012   Zoster Recombinat (Shingrix) 09/04/2016   Zoster, Live 03/21/2013    TDAP status: Due, Education has been provided regarding the  importance of this vaccine. Advised may receive this vaccine at local pharmacy or Health Dept. Aware to provide a copy of the vaccination record if obtained from local pharmacy or Health Dept. Verbalized acceptance and understanding.  Flu Vaccine status: Up to date  Pneumococcal vaccine status: Up to date  Covid-19 vaccine status: Declined, Education has been provided regarding the importance of this vaccine but patient still declined. Advised may receive this vaccine at local pharmacy or Health Dept.or vaccine clinic. Aware to provide a copy of the vaccination record if obtained from local pharmacy or Health Dept. Verbalized acceptance and understanding.  Qualifies for Shingles Vaccine? Yes   Zostavax completed Yes   Shingrix Completed?: Yes  Screening Tests Health Maintenance  Topic Date Due   Zoster Vaccines- Shingrix (2 of 2) 10/30/2016   COVID-19 Vaccine (3 - Pfizer series) 10/10/2019   INFLUENZA VACCINE  12/16/2021   TETANUS/TDAP  10/27/2022   Pneumonia Vaccine 62+ Years old  Completed   HPV VACCINES  Aged Out    Health Maintenance  Health Maintenance Due  Topic Date Due   Zoster Vaccines- Shingrix (2 of 2) 10/30/2016   COVID-19 Vaccine (3 - Pfizer series) 10/10/2019   INFLUENZA VACCINE  12/16/2021    Colorectal cancer screening: No longer required.   Lung Cancer Screening: (Low Dose CT Chest recommended if Age 41-80 years, 30 pack-year currently smoking OR have quit w/in 15years.) does not qualify.   Lung Cancer Screening Referral: n/a  Additional Screening:  Hepatitis C Screening: does not qualify; Completed aged out   Vision Screening: Recommended annual ophthalmology exams for early detection of glaucoma and other disorders of  the eye. Is the patient up to date with their annual eye exam?  Yes  Who is the provider or what is the name of the office in which the patient attends annual eye exams? [facility] Abed If pt is not established with a provider, would  they like to be referred to a provider to establish care? No .   Dental Screening: Recommended annual dental exams for proper oral hygiene  Community Resource Referral / Chronic Care Management: CRR required this visit?  No   CCM required this visit?  No      Plan:     I have personally reviewed and noted the following in the patient's chart:   Medical and social history Use of alcohol, tobacco or illicit drugs  Current medications and supplements including opioid prescriptions. Patient is not currently taking opioid prescriptions. Functional ability and status Nutritional status Physical activity Advanced directives List of other physicians Hospitalizations, surgeries, and ER visits in previous 12 months Vitals Screenings to include cognitive, depression, and falls Referrals and appointments  In addition, I have reviewed and discussed with patient certain preventive protocols, quality metrics, and best practice recommendations. A written personalized care plan for preventive services as well as general preventive health recommendations were provided to patient.     Salomon Mast Delbra Zellars, CMA   01/16/2022   Nurse Notes: none  Review and Agree with assessment & plan of CMA  [employer], PA-C

## 2022-01-16 NOTE — Patient Instructions (Signed)
[name] , Thank you for taking time to come for your Medicare Wellness Visit. I appreciate your ongoing commitment to your health goals. Please review the following plan we discussed and let me know if I can assist you in the future.   These are the goals we discussed:  Goals      DIET - EAT MORE FRUITS AND VEGETABLES     DIET - REDUCE SODIUM INTAKE     Maintain healthy active lifestyle     Patient Stated     Taking care of my wife        This is a list of the screening recommended for you and due dates:  Health Maintenance  Topic Date Due   Zoster (Shingles) Vaccine (2 of 2) 10/30/2016   COVID-19 Vaccine (3 - Pfizer series) 10/10/2019   Flu Shot  12/16/2021   Tetanus Vaccine  10/27/2022   Pneumonia Vaccine  Completed   HPV Vaccine  Aged Out     Next appointment: Follow up in one year for your annual wellness visit.   Preventive Care 31 Years and Older, Male Preventive care refers to lifestyle choices and visits with your health care provider that can promote health and wellness. What does preventive care include? A yearly physical exam. This is also called an annual well check. Dental exams once or twice a year. Routine eye exams. Ask your health care provider how often you should have your eyes checked. Personal lifestyle choices, including: Daily care of your teeth and gums. Regular physical activity. Eating a healthy diet. Avoiding tobacco and drug use. Limiting alcohol use. Practicing safe sex. Taking low doses of aspirin every day. Taking vitamin and mineral supplements as recommended by your health care provider. What happens during an annual well check? The services and screenings done by your health care provider during your annual well check will depend on your age, overall health, lifestyle risk factors, and family history of disease. Counseling  Your health care provider may ask you questions about your: Alcohol use. Tobacco use. Drug use. Emotional  well-being. Home and relationship well-being. Sexual activity. Eating habits. History of falls. Memory and ability to understand (cognition). Work and work Astronomer. Screening  You may have the following tests or measurements: Height, weight, and BMI. Blood pressure. Lipid and cholesterol levels. These may be checked every 5 years, or more frequently if you are over 30 years old. Skin check. Lung cancer screening. You may have this screening every year starting at age 73 if you have a 30-pack-year history of smoking and currently smoke or have quit within the past 15 years. Fecal occult blood test (FOBT) of the stool. You may have this test every year starting at age 40. Flexible sigmoidoscopy or colonoscopy. You may have a sigmoidoscopy every 5 years or a colonoscopy every 10 years starting at age 28. Prostate cancer screening. Recommendations will vary depending on your family history and other risks. Hepatitis C blood test. Hepatitis B blood test. Sexually transmitted disease (STD) testing. Diabetes screening. This is done by checking your blood sugar (glucose) after you have not eaten for a while (fasting). You may have this done every 1-3 years. Abdominal aortic aneurysm (AAA) screening. You may need this if you are a current or former smoker. Osteoporosis. You may be screened starting at age 8 if you are at high risk. Talk with your health care provider about your test results, treatment options, and if necessary, the need for more tests. Vaccines  Your health care provider may recommend certain vaccines, such as: Influenza vaccine. This is recommended every year. Tetanus, diphtheria, and acellular pertussis (Tdap, Td) vaccine. You may need a Td booster every 10 years. Zoster vaccine. You may need this after age 22. Pneumococcal 13-valent conjugate (PCV13) vaccine. One dose is recommended after age 64. Pneumococcal polysaccharide (PPSV23) vaccine. One dose is recommended after  age 92. Talk to your health care provider about which screenings and vaccines you need and how often you need them. This information is not intended to replace advice given to you by your health care provider. Make sure you discuss any questions you have with your health care provider. Document Released: 05/31/2015 Document Revised: 01/22/2016 Document Reviewed: 03/05/2015 Elsevier Interactive Patient Education  2017 [employer].  Fall Prevention in the Home Falls can cause injuries. They can happen to people of all ages. There are many things you can do to make your home safe and to help prevent falls. What can I do on the outside of my home? Regularly fix the edges of walkways and driveways and fix any cracks. Remove anything that might make you trip as you walk through a door, such as a raised step or threshold. Trim any bushes or trees on the path to your home. Use bright outdoor lighting. Clear any walking paths of anything that might make someone trip, such as rocks or tools. Regularly check to see if handrails are loose or broken. Make sure that both sides of any steps have handrails. Any raised decks and porches should have guardrails on the edges. Have any leaves, snow, or ice cleared regularly. Use sand or salt on walking paths during winter. Clean up any spills in your garage right away. This includes oil or grease spills. What can I do in the bathroom? Use night lights. Install grab bars by the toilet and in the tub and shower. Do not use towel bars as grab bars. Use non-skid mats or decals in the tub or shower. If you need to sit down in the shower, use a plastic, non-slip stool. Keep the floor dry. Clean up any water that spills on the floor as soon as it happens. Remove soap buildup in the tub or shower regularly. Attach bath mats securely with double-sided non-slip rug tape. Do not have throw rugs and other things on the floor that can make you trip. What can I do in the  bedroom? Use night lights. Make sure that you have a light by your bed that is easy to reach. Do not use any sheets or blankets that are too big for your bed. They should not hang down onto the floor. Have a firm chair that has side arms. You can use this for support while you get dressed. Do not have throw rugs and other things on the floor that can make you trip. What can I do in the kitchen? Clean up any spills right away. Avoid walking on wet floors. Keep items that you use a lot in easy-to-reach places. If you need to reach something above you, use a strong step stool that has a grab bar. Keep electrical cords out of the way. Do not use floor polish or wax that makes floors slippery. If you must use wax, use non-skid floor wax. Do not have throw rugs and other things on the floor that can make you trip. What can I do with my stairs? Do not leave any items on the stairs. Make sure that there are  handrails on both sides of the stairs and use them. Fix handrails that are broken or loose. Make sure that handrails are as long as the stairways. Check any carpeting to make sure that it is firmly attached to the stairs. Fix any carpet that is loose or worn. Avoid having throw rugs at the top or bottom of the stairs. If you do have throw rugs, attach them to the floor with carpet tape. Make sure that you have a light switch at the top of the stairs and the bottom of the stairs. If you do not have them, ask someone to add them for you. What else can I do to help prevent falls? Wear shoes that: Do not have high heels. Have rubber bottoms. Are comfortable and fit you well. Are closed at the toe. Do not wear sandals. If you use a stepladder: Make sure that it is fully opened. Do not climb a closed stepladder. Make sure that both sides of the stepladder are locked into place. Ask someone to hold it for you, if possible. Clearly mark and make sure that you can see: Any grab bars or  handrails. First and last steps. Where the edge of each step is. Use tools that help you move around (mobility aids) if they are needed. These include: Canes. Walkers. Scooters. Crutches. Turn on the lights when you go into a dark area. Replace any light bulbs as soon as they burn out. Set up your furniture so you have a clear path. Avoid moving your furniture around. If any of your floors are uneven, fix them. If there are any pets around you, be aware of where they are. Review your medicines with your doctor. Some medicines can make you feel dizzy. This can increase your chance of falling. Ask your doctor what other things that you can do to help prevent falls. This information is not intended to replace advice given to you by your health care provider. Make sure you discuss any questions you have with your health care provider. Document Released: 02/28/2009 Document Revised: 10/10/2015 Document Reviewed: 06/08/2014 Elsevier Interactive Patient Education  2017 [employer].

## 2022-01-27 ENCOUNTER — Encounter: Payer: Self-pay | Admitting: Medical

## 2022-01-27 ENCOUNTER — Ambulatory Visit (INDEPENDENT_AMBULATORY_CARE_PROVIDER_SITE_OTHER): Payer: Medicare Other | Admitting: Medical

## 2022-01-27 VITALS — BP 135/60 | HR 51 | Temp 97.9°F | Resp 18 | Ht 70.0 in | Wt 184.6 lb

## 2022-01-27 DIAGNOSIS — N401 Enlarged prostate with lower urinary tract symptoms: Secondary | ICD-10-CM

## 2022-01-27 DIAGNOSIS — R002 Palpitations: Secondary | ICD-10-CM

## 2022-01-27 DIAGNOSIS — R3911 Hesitancy of micturition: Secondary | ICD-10-CM

## 2022-01-27 DIAGNOSIS — R972 Elevated prostate specific antigen [PSA]: Secondary | ICD-10-CM

## 2022-01-27 DIAGNOSIS — I10 Essential (primary) hypertension: Secondary | ICD-10-CM | POA: Diagnosis not present

## 2022-01-27 DIAGNOSIS — R739 Hyperglycemia, unspecified: Secondary | ICD-10-CM

## 2022-01-27 DIAGNOSIS — R351 Nocturia: Secondary | ICD-10-CM

## 2022-01-27 LAB — POC URINALSYSI DIPSTICK (AUTOMATED)
Bilirubin, UA: NEGATIVE
Blood, UA: NEGATIVE
Glucose, UA: NEGATIVE
Ketones, UA: NEGATIVE
Leukocytes, UA: NEGATIVE
Nitrite, UA: NEGATIVE
Protein, UA: NEGATIVE
Spec Grav, UA: 1.015 (ref 1.010–1.025)
Urobilinogen, UA: 0.2 E.U./dL
pH, UA: 6 (ref 5.0–8.0)

## 2022-01-27 NOTE — Patient Instructions (Addendum)
For palpitation we did ekg today. Also went ahead and placed referral to cardiologist for probable zio patch monitoring. General advise on palpitation is to reduce or stop caffeine. However aware you in past did stop yet palpitation did occur. Your ekg today did show marked sinus bradycardia. On walk down hall pulse came up to 62.   For sporadic urinary symptoms will get UA, urine culture and psa. After lab results will decide on potential medication such as flomax and referral to urologist. Unclear what procedure you have in early 2000's.  Follow up date to be determined after lab results and cardiologist evaluation.

## 2022-01-27 NOTE — Progress Notes (Signed)
Subjective:    [name], male    DOB: 1940/05/30, 81 y.o.   MRN: [id number]  HPI Pt in for follow up on some recurrent palpitations that is occurring with some frequency but he can't specify how many times. He thinks last time ocured was today. He guesses at best last one minute but other times feels like skipped beat. He states 1 cup of coffee 3 days a week.  Pt does walk a lot at work. On days he does not work at [employer] does walk for at least an hour.  Pt notes in past he did stop coffee for some time and he states did not make difference.    Pt also states at night he has some urinary issues. Sometimes he has difficulty initialed the flow at times and then later will have to urinate again. This can occur 2-3 times. May occur at most 2 times in a month.    Pt states he had procedure for frequent urination in the past.     Review of Systems  Constitutional:  Negative for chills, fatigue and fever.  HENT:  Negative for congestion, drooling and ear discharge.   Respiratory:  Negative for cough, chest tightness, shortness of breath and wheezing.   Cardiovascular:  Negative for chest pain and palpitations.  Gastrointestinal:  Negative for abdominal pain.  Genitourinary:  Negative for dysuria.  Musculoskeletal:  Negative for back pain, myalgias and neck stiffness.  Skin:  Negative for rash.  Neurological:  Negative for facial asymmetry and headaches.  Hematological:  Negative for adenopathy. Does not bruise/bleed easily.  Psychiatric/Behavioral:  Negative for behavioral problems, confusion, decreased concentration and dysphoric mood.    No past medical history on file.   Social History   Socioeconomic History   Marital status: Married    Spouse name: Not on file   Number of children: Not on file   Years of education: Not on file   Highest education level: Not on file  Occupational History   Not on file  Tobacco Use   Smoking status: Never   Smokeless  tobacco: Never  Vaping Use   Vaping Use: Never used  Substance and Sexual Activity   Alcohol use: No   Drug use: No   Sexual activity: Yes  Other Topics Concern   Not on file  Social History Narrative   Not on file   Social Determinants of Health   Financial Resource Strain: Low Risk  (12/05/2020)   Overall Financial Resource Strain (CARDIA)    Difficulty of Paying Living Expenses: Not hard at all  Food Insecurity: No Food Insecurity (12/05/2020)   Hunger Vital Sign    Worried About Running Out of Food in the Last Year: Never true    Ran Out of Food in the Last Year: Never true  Transportation Needs: No Transportation Needs (12/05/2020)   PRAPARE - Administrator, Civil Service (Medical): No    Lack of Transportation (Non-Medical): No  Physical Activity: Insufficiently Active (12/05/2020)   Exercise Vital Sign    Days of Exercise per Week: 3 days    Minutes of Exercise per Session: 30 min  Stress: Stress Concern Present (12/05/2020)   [employer] of Occupational Health - Occupational Stress Questionnaire    Feeling of Stress : To some extent  Social Connections: Moderately Integrated (12/05/2020)   Social Connection and Isolation Panel [NHANES]    Frequency of Communication with Friends and Family: More than three  times a week    Frequency of Social Gatherings with Friends and Family: More than three times a week    Attends Religious Services: More than 4 times per year    Active Member of [employer] or Organizations: No    Attends Banker Meetings: Never    Marital Status: Married  Catering manager Violence: Not At Risk (12/05/2020)   Humiliation, Afraid, Rape, and Kick questionnaire    Fear of Current or Ex-Partner: No    Emotionally Abused: No    Physically Abused: No    Sexually Abused: No    Past Surgical History:  Procedure Laterality Date   EYE SURGERY Right 11/23/2019   cataract   HERNIA REPAIR     JOINT REPLACEMENT     PROSTATE SURGERY      TONSILLECTOMY      Family History  Problem Relation Age of Onset   Parkinson's disease Father    Parkinson's disease Brother     No Known Allergies  Current Outpatient Medications on File Prior to Visit  Medication Sig Dispense Refill   aspirin 81 MG tablet Take 81 mg by mouth daily.     losartan (COZAAR) 25 MG tablet Take 1 tablet (25 mg total) by mouth daily. 90 tablet 3   Multiple Vitamin (MULTIVITAMIN) tablet Take 1 tablet by mouth daily.     No current facility-administered medications on file prior to visit.    BP 135/60   Pulse (!) 51   Temp 97.9 F (36.6 C) (Temporal)   Resp 18   Ht 5\' 10"  (1.778 m)   Wt 184 lb 9.6 oz (83.7 kg)   SpO2 97%   BMI 26.49 kg/m        Objective:   Physical Exam  General Mental Status- Alert. General Appearance- Not in acute distress.   Skin General: Color- Normal Color. Moisture- Normal Moisture.  Neck Carotid Arteries- Normal color. Moisture- Normal Moisture. No carotid bruits. No JVD.  Chest and Lung Exam Auscultation: Breath Sounds:-Normal.  Cardiovascular Auscultation:Rythm- Regular. Murmurs & Other Heart Sounds:Auscultation of the heart reveals- No Murmurs.  Abdomen Inspection:-Inspeection Normal. Palpation/Percussion:Note:No mass. Palpation and Percussion of the abdomen reveal- Non Tender, Non Distended + BS, no rebound or guarding. No suprapubic tenderness    Neurologic Cranial Nerve exam:- CN III-XII intact(No nystagmus), symmetric smile. Strength:- 5/5 equal and symmetric strength both upper and lower extremities.    Back- no cva tenderness.     Assessment & Plan:   Patient Instructions  For palpitation we did ekg today. Also went ahead and placed referral to cardiologist for probable zio patch monitoring. General advise on palpitation is to reduce or stop caffeine. However aware you in past did stop yet palpitation did occur.    For sporadic urinary symptoms will get UA, urine culture and psa.  After lab results will decide on potential medication such as flomax and referral to urologist. Unclear what procedure you have in early 2000's.  Follow up date to be determined after lab results and cardiologist evaluation.     05-24-1986, PA-C

## 2022-01-28 ENCOUNTER — Encounter: Payer: Self-pay | Admitting: Cardiology

## 2022-01-28 ENCOUNTER — Ambulatory Visit: Payer: Medicare Other | Attending: Cardiology | Admitting: Cardiology

## 2022-01-28 VITALS — BP 122/74 | HR 59 | Ht 70.0 in | Wt 181.4 lb

## 2022-01-28 DIAGNOSIS — I10 Essential (primary) hypertension: Secondary | ICD-10-CM

## 2022-01-28 DIAGNOSIS — R002 Palpitations: Secondary | ICD-10-CM | POA: Diagnosis not present

## 2022-01-28 LAB — URINE CULTURE
MICRO NUMBER:: 13905635
SPECIMEN QUALITY:: ADEQUATE

## 2022-01-28 LAB — PSA: PSA: 3.26 ng/mL (ref 0.10–4.00)

## 2022-01-28 NOTE — Patient Instructions (Signed)
  Testing/Procedures:  Your physician has requested that you have an echocardiogram. Echocardiography is a painless test that uses sound waves to create images of your heart. It provides your doctor with information about the size and shape of your heart and how well your heart's chambers and valves are working. This procedure takes approximately one hour. There are no restrictions for this procedure. [address] FLOOR IMAGING DEPARTMENT [place] [facility]    Follow-Up: At [facility], you and your health needs are our priority.  As part of our continuing mission to provide you with exceptional heart care, we have created designated Provider Care Teams.  These Care Teams include your primary Cardiologist (physician) and Advanced Practice Providers (APPs -  Physician Assistants and Nurse Practitioners) who all work together to provide you with the care you need, when you need it.  We recommend signing up for the patient portal called "MyChart".  Sign up information is provided on this After Visit Summary.  MyChart is used to connect with patients for Virtual Visits (Telemedicine).  Patients are able to view lab/test results, encounter notes, upcoming appointments, etc.  Non-urgent messages can be sent to your provider as well.   To learn more about what you can do with MyChart, go to [email or web address].    Your next appointment:   6 month(s)  The format for your next appointment:   In Person  Provider:   Olga Millers, MD

## 2022-01-28 NOTE — Addendum Note (Signed)
Addended by: Gwenevere Abbot on: 01/28/2022 07:22 PM   Modules accepted: Orders

## 2022-01-28 NOTE — Progress Notes (Signed)
Referring-Edward Saguier PA-C Reason for referral-palpitations  HPI: 81 year old male for evaluation of palpitations at request of [employer].  Laboratories May 2023 showed sodium 140, potassium 4.5, creatinine 0.99.  Patient states that he occasionally feels palpitations.  These are described as a "hard beat".  They are not sustained.  There is no associated symptoms.  He otherwise denies dyspnea on exertion, orthopnea, PND, pedal edema, syncope or chest pain.  Occasional dizziness with standing.  He is under significant amount of stress as his wife has dementia.  Current Outpatient Medications  Medication Sig Dispense Refill   aspirin 81 MG tablet Take 81 mg by mouth daily.     losartan (COZAAR) 25 MG tablet Take 1 tablet (25 mg total) by mouth daily. 90 tablet 3   Multiple Vitamin (MULTIVITAMIN) tablet Take 1 tablet by mouth daily.     No current facility-administered medications for this visit.    No Known Allergies   Past Medical History:  Diagnosis Date   BPH (benign prostatic hyperplasia)    Hypertension     Past Surgical History:  Procedure Laterality Date   EYE SURGERY Right 11/23/2019   cataract   HERNIA REPAIR     JOINT REPLACEMENT     PROSTATE SURGERY     TONSILLECTOMY      Social History   Socioeconomic History   Marital status: Married    Spouse name: Not on file   Number of children: 2   Years of education: Not on file   Highest education level: Not on file  Occupational History   Not on file  Tobacco Use   Smoking status: Never   Smokeless tobacco: Never  Vaping Use   Vaping Use: Never used  Substance and Sexual Activity   Alcohol use: Yes    Comment: Occasional   Drug use: No   Sexual activity: Yes  Other Topics Concern   Not on file  Social History Narrative   Not on file   Social Determinants of Health   Financial Resource Strain: Low Risk  (12/05/2020)   Overall Financial Resource Strain (CARDIA)    Difficulty of Paying  Living Expenses: Not hard at all  Food Insecurity: No Food Insecurity (12/05/2020)   Hunger Vital Sign    Worried About Running Out of Food in the Last Year: Never true    Ran Out of Food in the Last Year: Never true  Transportation Needs: No Transportation Needs (12/05/2020)   PRAPARE - Administrator, Civil Service (Medical): No    Lack of Transportation (Non-Medical): No  Physical Activity: Insufficiently Active (12/05/2020)   Exercise Vital Sign    Days of Exercise per Week: 3 days    Minutes of Exercise per Session: 30 min  Stress: Stress Concern Present (12/05/2020)   [employer] of Occupational Health - Occupational Stress Questionnaire    Feeling of Stress : To some extent  Social Connections: Moderately Integrated (12/05/2020)   Social Connection and Isolation Panel [NHANES]    Frequency of Communication with Friends and Family: More than three times a week    Frequency of Social Gatherings with Friends and Family: More than three times a week    Attends Religious Services: More than 4 times per year    Active Member of [employer] or Organizations: No    Attends Banker Meetings: Never    Marital Status: Married  Catering manager Violence: Not At Risk (12/05/2020)   Humiliation, Afraid,  Rape, and Kick questionnaire    Fear of Current or Ex-Partner: No    Emotionally Abused: No    Physically Abused: No    Sexually Abused: No    Family History  Problem Relation Age of Onset   Parkinson's disease Father    Parkinson's disease Brother     ROS: no fevers or chills, productive cough, hemoptysis, dysphasia, odynophagia, melena, hematochezia, dysuria, hematuria, rash, seizure activity, orthopnea, PND, pedal edema, claudication. Remaining systems are negative.  Physical Exam:   Blood pressure 122/74, pulse (!) 59, height 5\' 10"  (1.778 m), weight 181 lb 6.4 oz (82.3 kg), SpO2 98 %.  General:  Well developed/well nourished in NAD Skin warm/dry Patient  not depressed No peripheral clubbing Back-normal HEENT-normal/normal eyelids Neck supple/normal carotid upstroke bilaterally; no bruits; no JVD; no thyromegaly chest - CTA/ normal expansion CV - RRR/normal S1 and S2; no murmurs, rubs or gallops;  PMI nondisplaced Abdomen -NT/ND, no HSM, no mass, + bowel sounds, no bruit 2+ femoral pulses, no bruits Ext-no edema, chords, 2+ DP Neuro-grossly nonfocal  ECG -January 27, 2022-sinus bradycardia at a rate of 48 with no ST changes.  Personally reviewed  A/P  1 palpitations-patient has occasional palpitations that sound likely to be PVCs.  If they worsen we will consider a monitor to further assess.  Schedule echocardiogram to assess LV function.  We could consider a beta-blocker in the future but he would not likely tolerate this given baseline bradycardia.  2 hypertension-blood pressure controlled.  Continue losartan at present dose.  He has occasional dizziness with standing.  If this persist would consider discontinuing losartan to allow blood pressure to run higher.  January 29, 2022, MD

## 2022-02-02 ENCOUNTER — Ambulatory Visit (HOSPITAL_BASED_OUTPATIENT_CLINIC_OR_DEPARTMENT_OTHER)
Admission: RE | Admit: 2022-02-02 | Discharge: 2022-02-02 | Disposition: A | Discharge status: Home / Self Care | Payer: Medicare Other | Source: Ambulatory Visit | Attending: Cardiology | Admitting: Cardiology

## 2022-02-02 DIAGNOSIS — I340 Nonrheumatic mitral (valve) insufficiency: Secondary | ICD-10-CM | POA: Diagnosis not present

## 2022-02-02 DIAGNOSIS — R002 Palpitations: Secondary | ICD-10-CM | POA: Diagnosis not present

## 2022-02-02 DIAGNOSIS — I080 Rheumatic disorders of both mitral and aortic valves: Secondary | ICD-10-CM | POA: Insufficient documentation

## 2022-02-02 DIAGNOSIS — R001 Bradycardia, unspecified: Secondary | ICD-10-CM | POA: Insufficient documentation

## 2022-02-02 DIAGNOSIS — I10 Essential (primary) hypertension: Secondary | ICD-10-CM | POA: Diagnosis not present

## 2022-02-02 DIAGNOSIS — I351 Nonrheumatic aortic (valve) insufficiency: Secondary | ICD-10-CM

## 2022-02-02 LAB — ECHOCARDIOGRAM COMPLETE
AR max vel: 1.55 cm2
AV Area VTI: 1.57 cm2
AV Area mean vel: 1.53 cm2
AV Mean grad: 9 mmHg
AV Peak grad: 18.5 mmHg
Ao pk vel: 2.15 m/s
Area-P 1/2: 2.36 cm2
S' Lateral: 3 cm

## 2022-02-02 NOTE — Progress Notes (Signed)
  Echocardiogram 2D Echocardiogram has been performed.  Merrie Roof F 02/02/2022, 10:26 AM

## 2022-06-28 ENCOUNTER — Other Ambulatory Visit: Payer: Self-pay | Admitting: Medical

## 2022-07-08 NOTE — Progress Notes (Deleted)
HPI: Follow-up palpitations.  Echocardiogram 99991111 diastolic dysfunction, mild mitral with mean gradient 9 mmHg/aortic valve area 1.5 cm, mild aortic insufficiency.  Since last seen  Current Outpatient Medications  Medication Sig Dispense Refill   aspirin 81 MG tablet Take 81 mg by mouth daily.     losartan (COZAAR) 25 MG tablet TAKE ONE TABLET BY MOUTH DAILY 90 tablet 3   Multiple Vitamin (MULTIVITAMIN) tablet Take 1 tablet by mouth daily.     No current facility-administered medications for this visit.     Past Medical History:  Diagnosis Date   BPH (benign prostatic hyperplasia)    Hypertension     Past Surgical History:  Procedure Laterality Date   EYE SURGERY Right 11/23/2019   cataract   HERNIA REPAIR     JOINT REPLACEMENT     PROSTATE SURGERY     TONSILLECTOMY      Social History   Socioeconomic History   Marital status: Married    Spouse name: Not on file   Number of children: 2   Years of education: Not on file   Highest education level: Not on file  Occupational History   Not on file  Tobacco Use   Smoking status: Never   Smokeless tobacco: Never  Vaping Use   Vaping Use: Never used  Substance and Sexual Activity   Alcohol use: Yes    Comment: Occasional   Drug use: No   Sexual activity: Yes  Other Topics Concern   Not on file  Social History Narrative   Not on file   Social Determinants of Health   Financial Resource Strain: Low Risk  (12/05/2020)   Overall Financial Resource Strain (CARDIA)    Difficulty of Paying Living Expenses: Not hard at all  Food Insecurity: No Food Insecurity (12/05/2020)   Hunger Vital Sign    Worried About Running Out of Food in the Last Year: Never true    [place] in the Last Year: Never true  Transportation Needs: No Transportation Needs (12/05/2020)   PRAPARE - Hydrologist (Medical): No    Lack of Transportation (Non-Medical): No  Physical Activity: Insufficiently  Active (12/05/2020)   Exercise Vital Sign    Days of Exercise per Week: 3 days    Minutes of Exercise per Session: 30 min  Stress: Stress Concern Present (12/05/2020)   [place]    Feeling of Stress : To some extent  Social Connections: Moderately Integrated (12/05/2020)   Social Connection and Isolation Panel [NHANES]    Frequency of Communication with Friends and Family: More than three times a week    Frequency of Social Gatherings with Friends and Family: More than three times a week    Attends Religious Services: More than 4 times per year    Active Member of [employer] or Organizations: No    Attends Archivist Meetings: Never    Marital Status: Married  Human resources officer Violence: Not At Risk (12/05/2020)   Humiliation, Afraid, Rape, and Kick questionnaire    Fear of Current or Ex-Partner: No    Emotionally Abused: No    Physically Abused: No    Sexually Abused: No    Family History  Problem Relation Age of Onset   Parkinson's disease Father    Parkinson's disease Brother     ROS: no fevers or chills, productive cough, hemoptysis, dysphasia, odynophagia, melena, hematochezia, dysuria, hematuria, rash,  seizure activity, orthopnea, PND, pedal edema, claudication. Remaining systems are negative.  Physical Exam: Well-developed well-nourished in no acute distress.  Skin is warm and dry.  HEENT is normal.  Neck is supple.  Chest is clear to auscultation with normal expansion.  Cardiovascular exam is regular rate and rhythm.  Abdominal exam nontender or distended. No masses palpated. Extremities show no edema. neuro grossly intact  ECG- personally reviewed  A/P  1 palpitations-previously felt likely to be related to PVCs.  Echocardiogram shows normal LV function.  Will consider monitor in the future if his symptoms worsen.  2 hypertension-patient's blood pressure is controlled.  Continue present  medications and follow-up.  Kirk Ruths, MD

## 2022-07-22 ENCOUNTER — Ambulatory Visit: Payer: Medicare Other | Admitting: Cardiology

## 2022-08-17 ENCOUNTER — Telehealth: Payer: Self-pay | Admitting: Medical

## 2022-08-17 NOTE — Telephone Encounter (Signed)
Pt called stating that he wanted to look into getting a referral to the Urology office in our building. Pt stated that he had discussed it with [place] before but there was no apparent note or referral issued.

## 2022-08-18 NOTE — Telephone Encounter (Signed)
Pt has an appt 09/08/22

## 2022-08-18 NOTE — Addendum Note (Signed)
Addended by: Anabel Halon on: 08/18/2022 12:13 PM   Modules accepted: Orders

## 2022-08-31 ENCOUNTER — Encounter: Payer: Self-pay | Admitting: *Deleted

## 2022-09-08 ENCOUNTER — Encounter: Payer: Self-pay | Admitting: Urology

## 2022-09-08 ENCOUNTER — Ambulatory Visit: Payer: Medicare Other | Admitting: Urology

## 2022-09-08 VITALS — BP 183/79 | HR 55 | Ht 70.0 in | Wt 180.0 lb

## 2022-09-08 DIAGNOSIS — R351 Nocturia: Secondary | ICD-10-CM

## 2022-09-08 DIAGNOSIS — N401 Enlarged prostate with lower urinary tract symptoms: Secondary | ICD-10-CM

## 2022-09-08 DIAGNOSIS — N138 Other obstructive and reflux uropathy: Secondary | ICD-10-CM | POA: Diagnosis not present

## 2022-09-08 LAB — URINALYSIS, ROUTINE W REFLEX MICROSCOPIC
Bilirubin, UA: NEGATIVE
Glucose, UA: NEGATIVE
Ketones, UA: NEGATIVE
Leukocytes,UA: NEGATIVE
Nitrite, UA: NEGATIVE
Protein,UA: NEGATIVE
Specific Gravity, UA: 1.025 (ref 1.005–1.030)
Urobilinogen, Ur: 0.2 mg/dL (ref 0.2–1.0)
pH, UA: 6 (ref 5.0–7.5)

## 2022-09-08 LAB — MICROSCOPIC EXAMINATION
Cast Type: NONE SEEN
Casts: NONE SEEN /lpf
Crystal Type: NONE SEEN
Crystals: NONE SEEN
Trichomonas, UA: NONE SEEN
Yeast, UA: NONE SEEN

## 2022-09-08 LAB — BLADDER SCAN AMB NON-IMAGING

## 2022-09-08 MED ORDER — MIRABEGRON ER 25 MG PO TB24: 25.0000 mg | ORAL_TABLET | Freq: Every day | ORAL | 0 refills | Status: DC

## 2022-09-08 NOTE — Progress Notes (Signed)
Assessment: 1. BPH with obstruction/lower urinary tract symptoms     Plan: I personally reviewed the patient's chart including provider notes, and lab results. His lower urinary tract symptoms are primarily occurring at night.  He is emptying his bladder well. Trial of Myrbetriq 25 mg nightly.  Samples provided.  Use and side effects discussed. Return to office in 1 month.  Chief Complaint:  Chief Complaint  Patient presents with   Benign Prostatic Hypertrophy    History of Present Illness:  Roger Williams is a 82 y.o. male who is seen in consultation from Saguier, Ramon Dredge, [place] for evaluation of BPH with lower urinary tract symptoms.  He is status post a TURP by Dr. Gerome Sam about 20 years ago.  He reports of men in his urinary symptoms following the procedure.  He has had gradual worsening of his lower urinary tract symptoms over the past 3-6 months.  He currently has urinary frequency, voiding every 60-90 minutes, occasional urgency, nocturia 2-4 times, decreased stream, primarily at night and some hesitancy, primarily at night.  No dysuria or gross hematuria.  No urinary incontinence.  No recent UTIs.  He has not tried any medical therapy for his symptoms.  PSA 9/23:  3.76  Past Medical History:  Past Medical History:  Diagnosis Date   BPH (benign prostatic hyperplasia)    Hypertension     Past Surgical History:  Past Surgical History:  Procedure Laterality Date   EYE SURGERY Right 11/23/2019   cataract   HERNIA REPAIR     JOINT REPLACEMENT     PROSTATE SURGERY     TONSILLECTOMY      Allergies:  No Known Allergies  Family History:  Family History  Problem Relation Age of Onset   Parkinson's disease Father    Parkinson's disease Brother     Social History:  Social History   Tobacco Use   Smoking status: Never   Smokeless tobacco: Never  Vaping Use   Vaping Use: Never used  Substance Use Topics   Alcohol use: Yes    Comment: Occasional   Drug use: No     Review of symptoms:  Constitutional:  Negative for unexplained weight loss, night sweats, fever, chills ENT:  Negative for nose bleeds, sinus pain, painful swallowing CV:  Negative for chest pain, shortness of breath, exercise intolerance, palpitations, loss of consciousness Resp:  Negative for cough, wheezing, shortness of breath GI:  Negative for nausea, vomiting, diarrhea, bloody stools GU:  Positives noted in HPI; otherwise negative for gross hematuria, dysuria, urinary incontinence Neuro:  Negative for seizures, poor balance, limb weakness, slurred speech Psych:  Negative for lack of energy, depression, anxiety Endocrine:  Negative for polydipsia, polyuria, symptoms of hypoglycemia (dizziness, hunger, sweating) Hematologic:  Negative for anemia, purpura, petechia, prolonged or excessive bleeding, use of anticoagulants  Allergic:  Negative for difficulty breathing or choking as a result of exposure to anything; no shellfish allergy; no allergic response (rash/itch) to materials, foods  Physical exam: BP (!) 183/79   Pulse (!) 55   Ht  (1.778 m)   Wt 180 lb (81.6 kg)   BMI 25.83 kg/m  GENERAL APPEARANCE:  Well appearing, well developed, well nourished, NAD HEENT: Atraumatic, Normocephalic, oropharynx clear. NECK: Supple without lymphadenopathy or thyromegaly. LUNGS: Clear to auscultation bilaterally. HEART: Regular Rate and Rhythm without murmurs, gallops, or rubs. ABDOMEN: Soft, non-tender, No Masses. EXTREMITIES: Moves all extremities well.  Without clubbing, cyanosis, or edema. NEUROLOGIC:  Alert and oriented x 3,  normal gait, CN II-XII grossly intact.  MENTAL STATUS:  Appropriate. BACK:  Non-tender to palpation.  No CVAT SKIN:  Warm, dry and intact.   GU: Penis:  uncircumcised Meatus: Normal Scrotum: normal, no masses Testis: normal without masses bilateral Epididymis: normal Prostate: 50 g, NT, no nodules Rectum: Normal tone,  no masses or  tenderness  Results: U/A: 0-5 WBC, 0-2 RBC, few bacteria  PVR: 17 ml

## 2022-10-06 ENCOUNTER — Ambulatory Visit: Payer: Medicare Other | Admitting: Urology

## 2022-10-06 ENCOUNTER — Encounter: Payer: Self-pay | Admitting: Urology

## 2022-10-06 VITALS — BP 177/77 | HR 60 | Ht 70.0 in | Wt 180.0 lb

## 2022-10-06 DIAGNOSIS — N401 Enlarged prostate with lower urinary tract symptoms: Secondary | ICD-10-CM | POA: Diagnosis not present

## 2022-10-06 DIAGNOSIS — R351 Nocturia: Secondary | ICD-10-CM | POA: Diagnosis not present

## 2022-10-06 DIAGNOSIS — N138 Other obstructive and reflux uropathy: Secondary | ICD-10-CM | POA: Diagnosis not present

## 2022-10-06 LAB — BLADDER SCAN AMB NON-IMAGING

## 2022-10-06 MED ORDER — MIRABEGRON ER 50 MG PO TB24: 50.0000 mg | ORAL_TABLET | Freq: Every day | ORAL | 0 refills | Status: DC

## 2022-10-06 NOTE — Progress Notes (Signed)
   Assessment: 1. BPH with obstruction/lower urinary tract symptoms   2. Nocturia     Plan: Trial of Myrbetriq 50 mg nightly.  Samples provided.  Advised patient to contact me with results of medication trial in 4 weeks. Return to office in 2 months.  Chief Complaint:  Chief Complaint  Patient presents with   Benign Prostatic Hypertrophy    History of Present Illness:  Roger Williams is a 82 y.o. male who is seen for further evaluation of BPH with lower urinary tract symptoms.  He is status post a TURP by Dr. Gerome Sam about 20 years ago.  He reported improvement in his urinary symptoms following the procedure.  He has had gradual worsening of his lower urinary tract symptoms over the past 3-6 months.  Current symptoms include urinary frequency, voiding every 60-90 minutes, occasional urgency, nocturia 2-4 times, decreased stream, primarily at night and some hesitancy, primarily at night.  No dysuria or gross hematuria.  No urinary incontinence.  No recent UTIs.  He had not tried any medical therapy for his symptoms.  PSA 9/23:  3.76  PVR 4/24: 17 mL He was given a trial of Myrbetriq 25 mg daily.  He returns today for follow-up.  He has noted some improvement in his urinary symptoms with decreased frequency and nocturia.  No side effects from the medication.  No dysuria or gross hematuria. IPSS = 21 today.  Portions of the above documentation were copied from a prior visit for review purposes only.   Past Medical History:  Past Medical History:  Diagnosis Date   BPH (benign prostatic hyperplasia)    Hypertension     Past Surgical History:  Past Surgical History:  Procedure Laterality Date   EYE SURGERY Right 11/23/2019   cataract   HERNIA REPAIR     JOINT REPLACEMENT     PROSTATE SURGERY     TONSILLECTOMY      Allergies:  No Known Allergies  Family History:  Family History  Problem Relation Age of Onset   Parkinson's disease Father    Parkinson's disease  Brother     Social History:  Social History   Tobacco Use   Smoking status: Never   Smokeless tobacco: Never  Vaping Use   Vaping Use: Never used  Substance Use Topics   Alcohol use: Yes    Comment: Occasional   Drug use: No    ROS: Constitutional:  Negative for fever, chills, weight loss CV: Negative for chest pain, previous MI, hypertension Respiratory:  Negative for shortness of breath, wheezing, sleep apnea, frequent cough GI:  Negative for nausea, vomiting, bloody stool, GERD  Physical exam: BP (!) 177/77   Pulse 60   Ht 5\' 10"  (1.778 m)   Wt 180 lb (81.6 kg)   BMI 25.83 kg/m  GENERAL APPEARANCE:  Well appearing, well developed, well nourished, NAD HEENT:  Atraumatic, normocephalic, oropharynx clear NECK:  Supple without lymphadenopathy or thyromegaly ABDOMEN:  Soft, non-tender, no masses EXTREMITIES:  Moves all extremities well, without clubbing, cyanosis, or edema NEUROLOGIC:  Alert and oriented x 3, normal gait, CN II-XII grossly intact MENTAL STATUS:  appropriate BACK:  Non-tender to palpation, No CVAT SKIN:  Warm, dry, and intact  Results: Unable to provide sample today.  Bladder scan:  53 ml  (patient voided about 1 hour prior)

## 2022-10-06 NOTE — Addendum Note (Signed)
Addended by: Lizbeth Bark on: 10/06/2022 10:38 AM   Modules accepted: Orders

## 2022-11-02 ENCOUNTER — Telehealth: Payer: Self-pay | Admitting: Urology

## 2022-11-02 ENCOUNTER — Other Ambulatory Visit: Payer: Self-pay | Admitting: Urology

## 2022-11-02 DIAGNOSIS — R351 Nocturia: Secondary | ICD-10-CM

## 2022-11-02 MED ORDER — MIRABEGRON ER 50 MG PO TB24: 50.0000 mg | ORAL_TABLET | Freq: Every day | ORAL | 11 refills | Status: DC

## 2022-11-02 NOTE — Telephone Encounter (Signed)
Patient ran out of the samples that were given to him. He has an appt. Coming up in July and would just like a script for just enough until he is seen again.  myrbetriq 50mg   [facility] [address], [place], [place] [address]  Pt phone #[phone]

## 2022-12-07 ENCOUNTER — Ambulatory Visit: Payer: Medicare Other | Admitting: Urology

## 2022-12-09 ENCOUNTER — Ambulatory Visit: Payer: Medicare Other | Admitting: Urology

## 2022-12-09 ENCOUNTER — Encounter: Payer: Self-pay | Admitting: Urology

## 2022-12-09 VITALS — BP 171/79 | HR 56 | Ht 70.0 in | Wt 180.0 lb

## 2022-12-09 DIAGNOSIS — N401 Enlarged prostate with lower urinary tract symptoms: Secondary | ICD-10-CM

## 2022-12-09 DIAGNOSIS — R351 Nocturia: Secondary | ICD-10-CM | POA: Diagnosis not present

## 2022-12-09 DIAGNOSIS — N138 Other obstructive and reflux uropathy: Secondary | ICD-10-CM | POA: Diagnosis not present

## 2022-12-09 LAB — URINALYSIS, ROUTINE W REFLEX MICROSCOPIC
Bilirubin, UA: NEGATIVE
Glucose, UA: NEGATIVE
Ketones, UA: NEGATIVE
Leukocytes,UA: NEGATIVE
Nitrite, UA: NEGATIVE
Protein,UA: NEGATIVE
RBC, UA: NEGATIVE
Specific Gravity, UA: 1.025 (ref 1.005–1.030)
Urobilinogen, Ur: 0.2 mg/dL (ref 0.2–1.0)
pH, UA: 5.5 (ref 5.0–7.5)

## 2022-12-09 NOTE — Progress Notes (Signed)
Assessment: 1. BPH with obstruction/lower urinary tract symptoms   2. Nocturia     Plan: His voiding symptoms have improved.  He would like to monitor at the present time. Return to office as needed.  Chief Complaint:  Chief Complaint  Patient presents with   Benign Prostatic Hypertrophy    History of Present Illness:  Roger Williams is a 82 y.o. male who is seen for further evaluation of BPH with lower urinary tract symptoms.  He is status post a TURP by Dr. Gerome Sam about 20 years ago.  He reported improvement in his urinary symptoms following the procedure.  He has had gradual worsening of his lower urinary tract symptoms over the past 3-6 months.  Current symptoms include urinary frequency, voiding every 60-90 minutes, occasional urgency, nocturia 2-4 times, decreased stream, primarily at night and some hesitancy, primarily at night.  No dysuria or gross hematuria.  No urinary incontinence.  No recent UTIs.  He had not tried any medical therapy for his symptoms.  PSA 9/23:  3.76  PVR 4/24: 17 mL He was given a trial of Myrbetriq 25 mg daily.  At his visit in May 2024, he noted some improvement in his urinary symptoms with decreased frequency and nocturia.  No side effects from the medication.  No dysuria or gross hematuria. IPSS = 21. He was given a trial of Myrbetriq 50 mg daily.  He returns today for follow-up.  He has noted improvement in his lower urinary tract symptoms.  He has not taken the Myrbetriq for approximately 2 weeks.  He reports nocturia 1-2 times.  He feels like his other urinary symptoms are improved.  He is currently satisfied with his voiding pattern off medication.  No dysuria or gross hematuria. IPSS = 9 today.  Portions of the above documentation were copied from a prior visit for review purposes only.   Past Medical History:  Past Medical History:  Diagnosis Date   BPH (benign prostatic hyperplasia)    Hypertension     Past Surgical History:   Past Surgical History:  Procedure Laterality Date   EYE SURGERY Right 11/23/2019   cataract   HERNIA REPAIR     JOINT REPLACEMENT     PROSTATE SURGERY     TONSILLECTOMY      Allergies:  No Known Allergies  Family History:  Family History  Problem Relation Age of Onset   Parkinson's disease Father    Parkinson's disease Brother     Social History:  Social History   Tobacco Use   Smoking status: Never   Smokeless tobacco: Never  Vaping Use   Vaping status: Never Used  Substance Use Topics   Alcohol use: Yes    Comment: Occasional   Drug use: No    ROS: Constitutional:  Negative for fever, chills, weight loss CV: Negative for chest pain, previous MI, hypertension Respiratory:  Negative for shortness of breath, wheezing, sleep apnea, frequent cough GI:  Negative for nausea, vomiting, bloody stool, GERD  Physical exam: BP (!) 171/79   Pulse (!) 56   Ht 5\' 10"  (1.778 m)   Wt 180 lb (81.6 kg)   BMI 25.83 kg/m  GENERAL APPEARANCE:  Well appearing, well developed, well nourished, NAD HEENT:  Atraumatic, normocephalic, oropharynx clear NECK:  Supple without lymphadenopathy or thyromegaly ABDOMEN:  Soft, non-tender, no masses EXTREMITIES:  Moves all extremities well, without clubbing, cyanosis, or edema NEUROLOGIC:  Alert and oriented x 3, normal gait, CN II-XII grossly intact MENTAL  STATUS:  appropriate BACK:  Non-tender to palpation, No CVAT SKIN:  Warm, dry, and intact  Results: U/A: Negative

## 2023-01-21 ENCOUNTER — Ambulatory Visit (INDEPENDENT_AMBULATORY_CARE_PROVIDER_SITE_OTHER): Payer: Medicare Other | Admitting: *Deleted

## 2023-01-21 VITALS — BP 170/77

## 2023-01-21 DIAGNOSIS — Z0000 Encounter for general adult medical examination without abnormal findings: Secondary | ICD-10-CM

## 2023-01-21 NOTE — Progress Notes (Signed)
Subjective:   Roger Williams is a 82 y.o. male who presents for Medicare Annual/Subsequent preventive examination.  Visit Complete: Virtual  I connected with  Roger Williams on 01/21/23 by a audio enabled telemedicine application and verified that I am speaking with the correct person using two identifiers.  Patient Location: Home  Provider Location: Office/Clinic  I discussed the limitations of evaluation and management by telemedicine. The patient expressed understanding and agreed to proceed.   Review of Systems     Cardiac Risk Factors include: advanced age (>29men, >7 women);male gender;hypertension     Objective:   Vital Signs: Vital signs are patient reported.  Today's Vitals   01/21/23 0842 01/21/23 0843  BP: (!) 153/71 (!) 170/77   There is no height or weight on file to calculate BMI.     01/21/2023    8:30 AM 01/16/2022    8:19 AM 12/05/2020    1:37 PM 12/05/2019    1:07 PM 07/26/2019    7:59 AM 04/30/2019    2:16 PM 06/03/2018    8:11 AM  Advanced Directives  Does Patient Have a Medical Advance Directive? Yes Yes Yes Yes Yes Yes Yes  Type of Estate agent of [place];Living will Healthcare Power of [place];Living will Healthcare Power of [place];Living will Living will  Living will Healthcare Power of [place];Living will  Does patient want to make changes to medical advance directive? No - Patient declined No - Patient declined  No - Patient declined No - Patient declined No - Patient declined   Copy of Healthcare Power of Attorney in Chart? No - copy requested  No - copy requested    No - copy requested    Current Medications (verified) Outpatient Encounter Medications as of 01/21/2023  Medication Sig   aspirin 81 MG tablet Take 81 mg by mouth daily.   losartan (COZAAR) 25 MG tablet TAKE ONE TABLET BY MOUTH DAILY   Multiple Vitamin (MULTIVITAMIN) tablet Take 1 tablet by mouth daily.   [DISCONTINUED] mirabegron ER (MYRBETRIQ) 50 MG TB24  tablet Take 1 tablet (50 mg total) by mouth daily. (Patient not taking: Reported on 12/09/2022)   No facility-administered encounter medications on file as of 01/21/2023.    Allergies (verified) Patient has no known allergies.   History: Past Medical History:  Diagnosis Date   BPH (benign prostatic hyperplasia)    Hypertension    Past Surgical History:  Procedure Laterality Date   EYE SURGERY Right 11/23/2019   cataract   HERNIA REPAIR     JOINT REPLACEMENT     PROSTATE SURGERY     TONSILLECTOMY     Family History  Problem Relation Age of Onset   Parkinson's disease Father    Parkinson's disease Brother    Social History   Socioeconomic History   Marital status: Married    Spouse name: Not on file   Number of children: 2   Years of education: Not on file   Highest education level: Not on file  Occupational History   Not on file  Tobacco Use   Smoking status: Never   Smokeless tobacco: Never  Vaping Use   Vaping status: Never Used  Substance and Sexual Activity   Alcohol use: Yes    Comment: Occasional   Drug use: No   Sexual activity: Yes  Other Topics Concern   Not on file  Social History Narrative   Not on file   Social Determinants of Health   Financial Resource  Strain: Low Risk  (01/21/2023)   Overall Financial Resource Strain (CARDIA)    Difficulty of Paying Living Expenses: Not hard at all  Food Insecurity: No Food Insecurity (01/21/2023)   Hunger Vital Sign    Worried About Running Out of Food in the Last Year: Never true    Ran Out of Food in the Last Year: Never true  Transportation Needs: No Transportation Needs (01/21/2023)   PRAPARE - Administrator, Civil Service (Medical): No    Lack of Transportation (Non-Medical): No  Physical Activity: Sufficiently Active (01/21/2023)   Exercise Vital Sign    Days of Exercise per Week: 5 days    Minutes of Exercise per Session: 30 min  Stress: No Stress Concern Present (01/21/2023)   [employer] of Occupational Health - Occupational Stress Questionnaire    Feeling of Stress : Only a little  Social Connections: Socially Integrated (01/21/2023)   Social Connection and Isolation Panel [NHANES]    Frequency of Communication with Friends and Family: More than three times a week    Frequency of Social Gatherings with Friends and Family: Three times a week    Attends Religious Services: More than 4 times per year    Active Member of Clubs or Organizations: Yes    Attends Engineer, structural: More than 4 times per year    Marital Status: Married    Tobacco Counseling Counseling given: Not Answered   Clinical Intake:  Pre-visit preparation completed: Yes  Pain : No/denies pain  Nutritional Risks: None Diabetes: No  How often do you need to have someone help you when you read instructions, pamphlets, or other written materials from your doctor or pharmacy?: 1 - Never  Interpreter Needed?: No  Information entered by :: Donne Anon, CMA   Activities of Daily Living    01/21/2023    8:22 AM  In your present state of health, do you have any difficulty performing the following activities:  Hearing? 0  Vision? 0  Difficulty concentrating or making decisions? 0  Walking or climbing stairs? 0  Dressing or bathing? 0  Doing errands, shopping? 0  Preparing Food and eating ? N  Using the Toilet? N  In the past six months, have you accidently leaked urine? N  Do you have problems with loss of bowel control? N  Managing your Medications? N  Managing your Finances? N  Housekeeping or managing your Housekeeping? N    Patient Care Team: Saguier, Kateri Mc as PCP - General (Internal Medicine)  Indicate any recent Medical Services you may have received from other than [facility] providers in the past year (date may be approximate).     Assessment:   This is a routine wellness examination for Roger Williams.  Hearing/Vision screen No results found.   Goals Addressed    None    Depression Screen    01/21/2023    8:30 AM 01/16/2022    8:20 AM 12/05/2020    1:39 PM 12/05/2019    1:10 PM 06/03/2018    8:12 AM 12/25/2015    8:13 AM 11/12/2014    7:59 AM  PHQ 2/9 Scores  PHQ - 2 Score 0 0 1 0 0 0 0  PHQ- 9 Score      0     Fall Risk    01/21/2023    8:24 AM 01/16/2022    8:19 AM 12/05/2020    1:38 PM 12/05/2019    1:10 PM 06/03/2018  8:12 AM  Fall Risk   Falls in the past year? 1 0 0 0 0  Number falls in past yr: 0 0 0 0   Injury with Fall? 0 0 0 0   Risk for fall due to : No Fall Risks No Fall Risks     Follow up Falls evaluation completed Falls evaluation completed Falls prevention discussed Education provided;Falls prevention discussed     MEDICARE RISK AT HOME: Medicare Risk at Home Any stairs in or around the home?: No If so, are there any without handrails?: No Home free of loose throw rugs in walkways, pet beds, electrical cords, etc?: Yes Adequate lighting in your home to reduce risk of falls?: Yes Life alert?: No Use of a cane, walker or w/c?: No Grab bars in the bathroom?: No Shower chair or bench in shower?: Yes (built in bench) Elevated toilet seat or a handicapped toilet?: No  TIMED UP AND GO:  Was the test performed?  No    Cognitive Function:        01/21/2023    8:31 AM 01/16/2022    8:23 AM  6CIT Screen  What Year? 0 points 0 points  What month? 0 points 0 points  What time? 0 points 0 points  Count back from 20 0 points 2 points  Months in reverse 0 points 0 points  Repeat phrase 0 points 0 points  Total Score 0 points 2 points    Immunizations Immunization History  Administered Date(s) Administered   Influenza Split 02/15/2018   Influenza, High Dose Seasonal PF 04/10/2015, 01/30/2019   Influenza-Unspecified 02/01/2013, 03/21/2014, 02/10/2016   PFIZER(Purple Top)SARS-COV-2 Vaccination 07/16/2019, 08/15/2019   Pneumococcal Conjugate-13 11/12/2014   Pneumococcal Polysaccharide-23 12/25/2015   Tdap 10/26/2012    Zoster Recombinant(Shingrix) 09/04/2016   Zoster, Live 03/21/2013    TDAP status: Due, Education has been provided regarding the importance of this vaccine. Advised may receive this vaccine at local pharmacy or Health Dept. Aware to provide a copy of the vaccination record if obtained from local pharmacy or Health Dept. Verbalized acceptance and understanding.  Flu Vaccine status: Due, Education has been provided regarding the importance of this vaccine. Advised may receive this vaccine at local pharmacy or Health Dept. Aware to provide a copy of the vaccination record if obtained from local pharmacy or Health Dept. Verbalized acceptance and understanding.  Pneumococcal vaccine status: Up to date  Covid-19 vaccine status: Information provided on how to obtain vaccines.   Qualifies for Shingles Vaccine? Yes   Zostavax completed Yes   Shingrix Completed?: No.    Education has been provided regarding the importance of this vaccine. Patient has been advised to call insurance company to determine out of pocket expense if they have not yet received this vaccine. Advised may also receive vaccine at local pharmacy or Health Dept. Verbalized acceptance and understanding.  Screening Tests Health Maintenance  Topic Date Due   Zoster Vaccines- Shingrix (2 of 2) 10/30/2016   DTaP/Tdap/Td (2 - Td or Tdap) 10/27/2022   COVID-19 Vaccine (3 - 2023-24 season) 01/17/2023   Medicare Annual Wellness (AWV)  01/17/2023   INFLUENZA VACCINE  08/16/2023 (Originally 12/17/2022)   Pneumonia Vaccine 8+ Years old  Completed   HPV VACCINES  Aged Out    Health Maintenance  Health Maintenance Due  Topic Date Due   Zoster Vaccines- Shingrix (2 of 2) 10/30/2016   DTaP/Tdap/Td (2 - Td or Tdap) 10/27/2022   COVID-19 Vaccine (3 - 2023-24 season) 01/17/2023  Medicare Annual Wellness (AWV)  01/17/2023    Colorectal cancer screening: No longer required.   Lung Cancer Screening: (Low Dose CT Chest recommended if Age  74-80 years, 20 pack-year currently smoking OR have quit w/in 15years.) does not qualify.   Additional Screening:  Hepatitis C Screening: does not qualify  Vision Screening: Recommended annual ophthalmology exams for early detection of glaucoma and other disorders of the eye. Is the patient up to date with their annual eye exam?  Yes  Who is the provider or what is the name of the office in which the patient attends annual eye exams? Dr. Lillia Abed - [facility] If pt is not established with a provider, would they like to be referred to a provider to establish care? No .   Dental Screening: Recommended annual dental exams for proper oral hygiene  Diabetic Foot Exam: N/a  Community Resource Referral / Chronic Care Management: CRR required this visit?  No   CCM required this visit?  No     Plan:     I have personally reviewed and noted the following in the patient's chart:   Medical and social history Use of alcohol, tobacco or illicit drugs  Current medications and supplements including opioid prescriptions. Patient is not currently taking opioid prescriptions. Functional ability and status Nutritional status Physical activity Advanced directives List of other physicians Hospitalizations, surgeries, and ER visits in previous 12 months Vitals Screenings to include cognitive, depression, and falls Referrals and appointments  In addition, I have reviewed and discussed with patient certain preventive protocols, quality metrics, and best practice recommendations. A written personalized care plan for preventive services as well as general preventive health recommendations were provided to patient.     Donne Anon, CMA   01/21/2023   After Visit Summary: (MyChart) Due to this being a telephonic visit, the after visit summary with patients personalized plan was offered to patient via MyChart   Nurse Notes: None

## 2023-01-21 NOTE — Patient Instructions (Signed)
Roger Williams , Thank you for taking time to come for your Medicare Wellness Visit. I appreciate your ongoing commitment to your health goals. Please review the following plan we discussed and let me know if I can assist you in the future.     This is a list of the screening recommended for you and due dates:  Health Maintenance  Topic Date Due   Zoster (Shingles) Vaccine (2 of 2) 10/30/2016   DTaP/Tdap/Td vaccine (2 - Td or Tdap) 10/27/2022   COVID-19 Vaccine (3 - 2023-24 season) 01/17/2023   Flu Shot  08/16/2023*   Medicare Annual Wellness Visit  01/21/2024   Pneumonia Vaccine  Completed   HPV Vaccine  Aged Out  *Topic was postponed. The date shown is not the original due date.    Next appointment: Follow up in one year for your annual wellness visit.   Preventive Care 48 Years and Older, Male Preventive care refers to lifestyle choices and visits with your health care provider that can promote health and wellness. What does preventive care include? A yearly physical exam. This is also called an annual well check. Dental exams once or twice a year. Routine eye exams. Ask your health care provider how often you should have your eyes checked. Personal lifestyle choices, including: Daily care of your teeth and gums. Regular physical activity. Eating a healthy diet. Avoiding tobacco and drug use. Limiting alcohol use. Practicing safe sex. Taking low doses of aspirin every day. Taking vitamin and mineral supplements as recommended by your health care provider. What happens during an annual well check? The services and screenings done by your health care provider during your annual well check will depend on your age, overall health, lifestyle risk factors, and family history of disease. Counseling  Your health care provider may ask you questions about your: Alcohol use. Tobacco use. Drug use. Emotional well-being. Home and relationship well-being. Sexual activity. Eating  habits. History of falls. Memory and ability to understand (cognition). Work and work Astronomer. Screening  You may have the following tests or measurements: Height, weight, and BMI. Blood pressure. Lipid and cholesterol levels. These may be checked every 5 years, or more frequently if you are over 82 years old. Skin check. Lung cancer screening. You may have this screening every year starting at age 8 if you have a 30-pack-year history of smoking and currently smoke or have quit within the past 15 years. Fecal occult blood test (FOBT) of the stool. You may have this test every year starting at age 29. Flexible sigmoidoscopy or colonoscopy. You may have a sigmoidoscopy every 5 years or a colonoscopy every 10 years starting at age 53. Prostate cancer screening. Recommendations will vary depending on your family history and other risks. Hepatitis C blood test. Hepatitis B blood test. Sexually transmitted disease (STD) testing. Diabetes screening. This is done by checking your blood sugar (glucose) after you have not eaten for a while (fasting). You may have this done every 1-3 years. Abdominal aortic aneurysm (AAA) screening. You may need this if you are a current or former smoker. Osteoporosis. You may be screened starting at age 59 if you are at high risk. Talk with your health care provider about your test results, treatment options, and if necessary, the need for more tests. Vaccines  Your health care provider may recommend certain vaccines, such as: Influenza vaccine. This is recommended every year. Tetanus, diphtheria, and acellular pertussis (Tdap, Td) vaccine. You may need a Td booster every 10  years. Zoster vaccine. You may need this after age 42. Pneumococcal 13-valent conjugate (PCV13) vaccine. One dose is recommended after age 49. Pneumococcal polysaccharide (PPSV23) vaccine. One dose is recommended after age 19. Talk to your health care provider about which screenings and  vaccines you need and how often you need them. This information is not intended to replace advice given to you by your health care provider. Make sure you discuss any questions you have with your health care provider. Document Released: 05/31/2015 Document Revised: 01/22/2016 Document Reviewed: 03/05/2015 Elsevier Interactive Patient Education  2017 [employer].  Fall Prevention in the Home Falls can cause injuries. They can happen to people of all ages. There are many things you can do to make your home safe and to help prevent falls. What can I do on the outside of my home? Regularly fix the edges of walkways and driveways and fix any cracks. Remove anything that might make you trip as you walk through a door, such as a raised step or threshold. Trim any bushes or trees on the path to your home. Use bright outdoor lighting. Clear any walking paths of anything that might make someone trip, such as rocks or tools. Regularly check to see if handrails are loose or broken. Make sure that both sides of any steps have handrails. Any raised decks and porches should have guardrails on the edges. Have any leaves, snow, or ice cleared regularly. Use sand or salt on walking paths during winter. Clean up any spills in your garage right away. This includes oil or grease spills. What can I do in the bathroom? Use night lights. Install grab bars by the toilet and in the tub and shower. Do not use towel bars as grab bars. Use non-skid mats or decals in the tub or shower. If you need to sit down in the shower, use a plastic, non-slip stool. Keep the floor dry. Clean up any water that spills on the floor as soon as it happens. Remove soap buildup in the tub or shower regularly. Attach bath mats securely with double-sided non-slip rug tape. Do not have throw rugs and other things on the floor that can make you trip. What can I do in the bedroom? Use night lights. Make sure that you have a light by your  bed that is easy to reach. Do not use any sheets or blankets that are too big for your bed. They should not hang down onto the floor. Have a firm chair that has side arms. You can use this for support while you get dressed. Do not have throw rugs and other things on the floor that can make you trip. What can I do in the kitchen? Clean up any spills right away. Avoid walking on wet floors. Keep items that you use a lot in easy-to-reach places. If you need to reach something above you, use a strong step stool that has a grab bar. Keep electrical cords out of the way. Do not use floor polish or wax that makes floors slippery. If you must use wax, use non-skid floor wax. Do not have throw rugs and other things on the floor that can make you trip. What can I do with my stairs? Do not leave any items on the stairs. Make sure that there are handrails on both sides of the stairs and use them. Fix handrails that are broken or loose. Make sure that handrails are as long as the stairways. Check any carpeting to make sure  that it is firmly attached to the stairs. Fix any carpet that is loose or worn. Avoid having throw rugs at the top or bottom of the stairs. If you do have throw rugs, attach them to the floor with carpet tape. Make sure that you have a light switch at the top of the stairs and the bottom of the stairs. If you do not have them, ask someone to add them for you. What else can I do to help prevent falls? Wear shoes that: Do not have high heels. Have rubber bottoms. Are comfortable and fit you well. Are closed at the toe. Do not wear sandals. If you use a stepladder: Make sure that it is fully opened. Do not climb a closed stepladder. Make sure that both sides of the stepladder are locked into place. Ask someone to hold it for you, if possible. Clearly mark and make sure that you can see: Any grab bars or handrails. First and last steps. Where the edge of each step is. Use tools that  help you move around (mobility aids) if they are needed. These include: Canes. Walkers. Scooters. Crutches. Turn on the lights when you go into a dark area. Replace any light bulbs as soon as they burn out. Set up your furniture so you have a clear path. Avoid moving your furniture around. If any of your floors are uneven, fix them. If there are any pets around you, be aware of where they are. Review your medicines with your doctor. Some medicines can make you feel dizzy. This can increase your chance of falling. Ask your doctor what other things that you can do to help prevent falls. This information is not intended to replace advice given to you by your health care provider. Make sure you discuss any questions you have with your health care provider. Document Released: 02/28/2009 Document Revised: 10/10/2015 Document Reviewed: 06/08/2014 Elsevier Interactive Patient Education  2017 [employer].

## 2023-06-21 NOTE — Telephone Encounter (Unsigned)
Copied from CRM [id number]. Topic: Clinical - Pink Word Triage >> Jun 21, 2023  8:57 AM Fredrich Romans wrote: Reason for Triage: having hard time hearing out of ear after cold

## 2023-06-22 ENCOUNTER — Encounter: Payer: Self-pay | Admitting: Family Medicine

## 2023-06-22 ENCOUNTER — Ambulatory Visit (INDEPENDENT_AMBULATORY_CARE_PROVIDER_SITE_OTHER): Payer: Medicare Other | Admitting: Family Medicine

## 2023-06-22 VITALS — BP 136/80 | HR 82 | Temp 97.9°F | Resp 18 | Ht 70.0 in | Wt 188.2 lb

## 2023-06-22 DIAGNOSIS — H6502 Acute serous otitis media, left ear: Secondary | ICD-10-CM

## 2023-06-22 DIAGNOSIS — I10 Essential (primary) hypertension: Secondary | ICD-10-CM

## 2023-06-22 MED ORDER — PREDNISONE 10 MG PO TABS
ORAL_TABLET | ORAL | 0 refills | Status: DC
Start: 2023-06-22 — End: 2023-09-22

## 2023-06-22 MED ORDER — LOSARTAN POTASSIUM 25 MG PO TABS: 25.0000 mg | ORAL_TABLET | Freq: Every day | ORAL | 3 refills | Status: DC

## 2023-06-22 NOTE — Progress Notes (Signed)
 Established Patient Office Visit  Subjective   [name], male    DOB: 1941/03/12  Age: 83 y.o. MRN: [id number]  Chief Complaint  Patient presents with   Hearing Problem    Pt states having a cold a couple weeks ago and states left ear echos. No pain    HPI Discussed the use of AI scribe software for clinical note transcription with the patient, who gave verbal consent to proceed.  History of Present Illness   Roger Williams is an 83 year old male who presents with persistent ear symptoms following a recent cold.  He has been experiencing persistent ear symptoms following a severe cold that occurred approximately two weeks ago, primarily affecting his head. Although the cold has resolved, he continues to experience an echoing sensation in his head when he talks and reports approximately 40% hearing loss in his left ear. No ear pain is present, but he describes a very dull sensation, likening it to feeling 'underwater'. No coughing, but he mentions having a little drainage.  He has been using Allegra and Flonase for less than a week to address these symptoms. His current medication regimen includes losartan  for blood pressure management, and he notes that he only has one more week of this medication left. He has not seen an ear, nose, and throat specialist before, although he has encountered them in a non-patient capacity.  He moved from [place]  to [place]  in 1987 after raising his children in [place] . His son is a engineer, civil (consulting) working for [employer] from home in [place].  His mother is 58 years old and has dementia. She is currently in hospice care.      Patient Active Problem List   Diagnosis Date Noted   BPH with obstruction/lower urinary tract symptoms 09/08/2022   Nocturia 09/08/2022   Primary osteoarthritis of right knee 10/17/2020   Gastroesophageal reflux disease without esophagitis 06/17/2015   Allergic reaction 04/24/2015   Rhus dermatitis 01/04/2015    Screening for ischemic heart disease 11/12/2014   Prostate cancer screening 11/12/2014   Routine history and physical examination of adult 11/12/2014   Need for prophylactic vaccination against Streptococcus pneumoniae (pneumococcus) 11/12/2014   Encounter for preventive health examination 12/20/2012   Past Medical History:  Diagnosis Date   BPH (benign prostatic hyperplasia)    Hypertension    Past Surgical History:  Procedure Laterality Date   EYE SURGERY Right 11/23/2019   cataract   HERNIA REPAIR     JOINT REPLACEMENT     PROSTATE SURGERY     TONSILLECTOMY     Social History   Tobacco Use   Smoking status: Never   Smokeless tobacco: Never  Vaping Use   Vaping status: Never Used  Substance Use Topics   Alcohol use: Yes    Comment: Occasional   Drug use: No   Social History   Socioeconomic History   Marital status: Married    Spouse name: Not on file   Number of children: 2   Years of education: Not on file   Highest education level: Not on file  Occupational History   Not on file  Tobacco Use   Smoking status: Never   Smokeless tobacco: Never  Vaping Use   Vaping status: Never Used  Substance and Sexual Activity   Alcohol use: Yes    Comment: Occasional   Drug use: No   Sexual activity: Yes  Other Topics Concern   Not on file  Social History Narrative   Not on file   Social Drivers of Health   Financial Resource Strain: Low Risk  (01/21/2023)   Overall Financial Resource Strain (CARDIA)    Difficulty of Paying Living Expenses: Not hard at all  Food Insecurity: No Food Insecurity (01/21/2023)   Hunger Vital Sign    Worried About Running Out of Food in the Last Year: Never true    Ran Out of Food in the Last Year: Never true  Transportation Needs: No Transportation Needs (01/21/2023)   PRAPARE - Administrator, Civil Service (Medical): No    Lack of Transportation (Non-Medical): No  Physical Activity: Sufficiently Active (01/21/2023)    Exercise Vital Sign    Days of Exercise per Week: 5 days    Minutes of Exercise per Session: 30 min  Stress: No Stress Concern Present (01/21/2023)   [employer] of Occupational Health - Occupational Stress Questionnaire    Feeling of Stress : Only a little  Social Connections: Socially Integrated (01/21/2023)   Social Connection and Isolation Panel [NHANES]    Frequency of Communication with Friends and Family: More than three times a week    Frequency of Social Gatherings with Friends and Family: Three times a week    Attends Religious Services: More than 4 times per year    Active Member of Clubs or Organizations: Yes    Attends Banker Meetings: More than 4 times per year    Marital Status: Married  Catering Manager Violence: Not At Risk (01/21/2023)   Humiliation, Afraid, Rape, and Kick questionnaire    Fear of Current or Ex-Partner: No    Emotionally Abused: No    Physically Abused: No    Sexually Abused: No   Family Status  Relation Name Status   Mother  Deceased   Father  Deceased   Sister  Alive   Brother  Deceased  No partnership data on file   Family History  Problem Relation Age of Onset   Parkinson's disease Father    Parkinson's disease Brother    No Known Allergies    Review of Systems  Constitutional:  Negative for chills, fever and malaise/fatigue.  HENT:  Positive for ear pain and hearing loss. Negative for congestion.   Eyes:  Negative for blurred vision and discharge.  Respiratory:  Negative for cough, sputum production and shortness of breath.   Cardiovascular:  Negative for chest pain, palpitations and leg swelling.  Gastrointestinal:  Negative for abdominal pain, blood in stool, constipation, diarrhea, heartburn, nausea and vomiting.  Genitourinary:  Negative for dysuria, frequency, hematuria and urgency.  Musculoskeletal:  Negative for back pain, falls and myalgias.  Skin:  Negative for rash.  Neurological:  Negative for dizziness,  sensory change, loss of consciousness, weakness and headaches.  Endo/Heme/Allergies:  Negative for environmental allergies. Does not bruise/bleed easily.  Psychiatric/Behavioral:  Negative for depression and suicidal ideas. The patient is not nervous/anxious and does not have insomnia.       Objective:     BP 136/80 (BP Location: Right Arm, Patient Position: Sitting, Cuff Size: Normal)   Pulse 82   Temp 97.9 F (36.6 C) (Oral)   Resp 18   Ht 5' 10 (1.778 m)   Wt 188 lb 3.2 oz (85.4 kg)   SpO2 95%   BMI 27.00 kg/m  BP Readings from Last 3 Encounters:  06/22/23 136/80  01/21/23 (!) 170/77  12/09/22 (!) 171/79   Wt Readings from Last 3 Encounters:  06/22/23 188 lb 3.2 oz (85.4 kg)  12/09/22 180 lb (81.6 kg)  10/06/22 180 lb (81.6 kg)   SpO2 Readings from Last 3 Encounters:  06/22/23 95%  01/28/22 98%  01/27/22 97%      Physical Exam Vitals and nursing note reviewed.  Constitutional:      General: He is not in acute distress.    Appearance: Normal appearance. He is well-developed.  HENT:     Head: Normocephalic and atraumatic.     Right Ear: Hearing, tympanic membrane, ear canal and external ear normal.     Left Ear: Decreased hearing noted. Tenderness present. A middle ear effusion is present.     Ears:     Comments: L TM dull  + fluid  Eyes:     General: No scleral icterus.       Right eye: No discharge.        Left eye: No discharge.  Cardiovascular:     Rate and Rhythm: Normal rate and regular rhythm.     Heart sounds: No murmur heard. Pulmonary:     Effort: Pulmonary effort is normal. No respiratory distress.     Breath sounds: Normal breath sounds.  Musculoskeletal:        General: Normal range of motion.     Cervical back: Normal range of motion and neck supple.     Right lower leg: No edema.     Left lower leg: No edema.  Skin:    General: Skin is warm and dry.  Neurological:     Mental Status: He is alert and oriented to person, place, and time.   Psychiatric:        Mood and Affect: Mood normal.        Behavior: Behavior normal.        Thought Content: Thought content normal.        Judgment: Judgment normal.      No results found for any visits on 06/22/23.  Last CBC Lab Results  Component Value Date   WBC 4.5 04/30/2019   HGB 15.0 04/30/2019   HCT 43.8 04/30/2019   MCV 93.8 04/30/2019   MCH 32.1 04/30/2019   RDW 12.1 04/30/2019   PLT 141 (L) 04/30/2019   Last metabolic panel Lab Results  Component Value Date   GLUCOSE 86 10/07/2021   NA 140 10/07/2021   K 4.5 10/07/2021   CL 104 10/07/2021   CO2 27 10/07/2021   BUN 21 10/07/2021   CREATININE 0.99 10/07/2021   GFR 71.37 10/07/2021   CALCIUM  9.2 10/07/2021   PROT 6.5 10/07/2021   ALBUMIN 4.5 10/07/2021   BILITOT 0.9 10/07/2021   ALKPHOS 69 10/07/2021   AST 25 10/07/2021   ALT 20 10/07/2021   ANIONGAP 8 04/30/2019   Last lipids Lab Results  Component Value Date   CHOL 161 06/19/2021   HDL 42.40 06/19/2021   LDLCALC 96 06/19/2021   TRIG 111.0 06/19/2021   CHOLHDL 4 06/19/2021   Last hemoglobin A1c Lab Results  Component Value Date   HGBA1C 5.6 10/07/2021   Last thyroid functions Lab Results  Component Value Date   TSH 1.717 12/20/2012   Last vitamin D No results found for: 25OHVITD2, 25OHVITD3, VD25OH Last vitamin B12 and Folate No results found for: VITAMINB12, FOLATE    The ASCVD Risk score (Arnett DK, et al., 2019) failed to calculate for the following reasons:   The 2019 ASCVD risk score is only valid for ages 58 to 28  Assessment & Plan:   Problem List Items Addressed This Visit   None Visit Diagnoses       Non-recurrent acute serous otitis media of left ear    -  Primary   Relevant Medications   predniSONE  (DELTASONE ) 10 MG tablet     Hypertension, unspecified type       Relevant Medications   losartan  (COZAAR ) 25 MG tablet     Assessment and Plan    Eustachian Tube Dysfunction Reports persistent echoing  and approximately 40% hearing loss in the left ear after a resolved head cold from two weeks ago. Examination shows a dull tympanic membrane in the left ear, indicating fluid presence without infection. Minimal improvement with Allegra and Flonase. Fluid resolution may take weeks. Antibiotics are not indicated; prednisone  may reduce inflammation. If no improvement, refer to ENT for further evaluation, including possible hearing tests and other interventions such as tubes. Prescribe prednisone  for 9 days. Continue Allegra and Flonase. Refer to ENT if symptoms persist after the prednisone  course.  Hypertension Well-managed on losartan . Refill losartan  prescription as only one week of medication remains.  General Health Maintenance Generally healthy for a 83 year old. Encourage continued use of prescribed medications. Advise to contact the clinic if any new symptoms arise.        No follow-ups on file.    Amelita Risinger R Lowne Chase, DO

## 2023-09-19 MED ORDER — MUPIROCIN 2 % EX OINT: 1.0000 | TOPICAL_OINTMENT | Freq: Two times a day (BID) | CUTANEOUS | 0 refills | Status: AC

## 2023-09-19 NOTE — Addendum Note (Signed)
 Addended by: Serafina Damme on: 09/19/2023 07:55 PM   Modules accepted: Orders

## 2023-09-22 ENCOUNTER — Encounter: Payer: Self-pay | Admitting: Medical

## 2023-09-22 ENCOUNTER — Ambulatory Visit (INDEPENDENT_AMBULATORY_CARE_PROVIDER_SITE_OTHER): Admitting: Medical

## 2023-09-22 VITALS — BP 168/81 | HR 67 | Temp 97.8°F | Resp 16 | Ht 70.0 in | Wt 188.0 lb

## 2023-09-22 DIAGNOSIS — R739 Hyperglycemia, unspecified: Secondary | ICD-10-CM | POA: Diagnosis not present

## 2023-09-22 DIAGNOSIS — I10 Essential (primary) hypertension: Secondary | ICD-10-CM | POA: Diagnosis not present

## 2023-09-22 DIAGNOSIS — S61011A Laceration without foreign body of right thumb without damage to nail, initial encounter: Secondary | ICD-10-CM | POA: Diagnosis not present

## 2023-09-22 LAB — COMPREHENSIVE METABOLIC PANEL WITH GFR
ALT: 15 U/L (ref 0–53)
AST: 21 U/L (ref 0–37)
Albumin: 4.4 g/dL (ref 3.5–5.2)
Alkaline Phosphatase: 59 U/L (ref 39–117)
BUN: 20 mg/dL (ref 6–23)
CO2: 28 meq/L (ref 19–32)
Calcium: 8.9 mg/dL (ref 8.4–10.5)
Chloride: 106 meq/L (ref 96–112)
Creatinine, Ser: 0.92 mg/dL (ref 0.40–1.50)
GFR: 76.87 mL/min (ref >60.00)
Glucose, Bld: 84 mg/dL (ref 70–99)
Potassium: 4.7 meq/L (ref 3.5–5.1)
Sodium: 140 meq/L (ref 135–145)
Total Bilirubin: 0.8 mg/dL (ref 0.2–1.2)
Total Protein: 6.4 g/dL (ref 6.0–8.3)

## 2023-09-22 LAB — HEMOGLOBIN A1C: Hgb A1c MFr Bld: 5.7 % (ref 4.6–6.5)

## 2023-09-22 NOTE — Patient Instructions (Signed)
 Hypertension Blood pressure initially elevated, improved with losartan . Office visit may have contributed to elevated readings. - Monitor blood pressure every other day for 7-10 days. - Report if consistently above 140 mmHg. - Consider increasing losartan  if elevated. - Order metabolic panel and HbA1c. - Schedule fasting lipid panel in future  Right thumb laceration (healing by secondary intention. estimate may take 2-4 weeks) Laceration healing with mupirocin , no infection, tetanus vaccination due.(Td) Get thru pharmacy. - Continue mupirocin  twice daily. - Keep wound covered during activities.   Follow-up Plans for blood pressure monitoring and lab results follow-up. - Call in 10-14 days with blood pressure readings. - Communicate lab results once available.

## 2023-09-22 NOTE — Progress Notes (Signed)
   Subjective:    [name], male    DOB: 1940-07-05, 83 y.o.   MRN: [id number]  HPI Roger Williams "Roger Williams" is an 83 year old male who presents with a right thumb laceration.  He sustained a right thumb laceration approximately one week ago when his thumb got caught in a window seal. The injury occurred on either Wednesday or Thursday of last week. Since then, he has been applying salve twice a day and changing the dressing regularly, although he did not change it on the morning of the visit. He started using mupirocin  ointment on Monday, after initially using Neosporin, and has noticed improvement in the wound's appearance, with better color and closure of the edges. No yellow discharge from the wound.  He has a history of previous injuries to his thumbs, including crushing injuries and an incident involving a trailer hitch, which may have resulted in a fracture. He cannot recall the exact timing of these past injuries. He describes his thumb nail as 'really tough' and 'concave,' and mentions that he has to regularly trim it to prevent skin growth underneath. He has not experienced any ingrown nails.  He is currently taking losartan  25 mg daily for high blood pressure.   Review of Systems  See hpi     Objective:   Physical Exam  General Mental Status- Alert. General Appearance- Not in acute distress.   Skin Rt thumb medial aspect about 8 mm long laceration about 90 degree angle adjacent to nail. Edges now well opposed and no dc. Scab formed. No DC. No fluctance or redness. Other day when I saw pt outside of office edges were not opposed. Skin edges were prominent white color. Now edges are pink more normal in color.    Chest and Lung Exam Auscultation: Breath Sounds:-CTA  Cardiovascular Auscultation:Rythm- RRR Murmurs & Other Heart Sounds:Auscultation of the heart reveals- No Murmurs.   Neurologic Cranial Nerve exam:- CN III-XII intact(No nystagmus), symmetric  smile. Strength:- 5/5 equal and symmetric strength both upper and lower extremities.       Assessment & Plan:   Patient Instructions  Hypertension Blood pressure initially elevated, improved with losartan . Office visit may have contributed to elevated readings. - Monitor blood pressure every other day for 7-10 days. - Report if consistently above 140 mmHg. - Consider increasing losartan  if elevated. - Order metabolic panel and HbA1c. - Schedule fasting lipid panel in future  Right thumb laceration (healing by secondary intention. estimate may take 2-4 weeks) Laceration healing with mupirocin , no infection, tetanus vaccination due.(Td) Get thru pharmacy. - Continue mupirocin  twice daily. - Keep wound covered during activities.   Follow-up Plans for blood pressure monitoring and lab results follow-up. - Call in 10-14 days with blood pressure readings. - Communicate lab results once available.   Merrisa Skorupski, PA-C

## 2023-10-06 DIAGNOSIS — L814 Other melanin hyperpigmentation: Secondary | ICD-10-CM | POA: Diagnosis not present

## 2023-10-06 DIAGNOSIS — L570 Actinic keratosis: Secondary | ICD-10-CM | POA: Diagnosis not present

## 2023-10-06 DIAGNOSIS — L821 Other seborrheic keratosis: Secondary | ICD-10-CM | POA: Diagnosis not present

## 2023-10-06 DIAGNOSIS — X32XXXS Exposure to sunlight, sequela: Secondary | ICD-10-CM | POA: Diagnosis not present

## 2023-10-06 DIAGNOSIS — D1801 Hemangioma of skin and subcutaneous tissue: Secondary | ICD-10-CM | POA: Diagnosis not present

## 2023-11-24 ENCOUNTER — Telehealth: Payer: Self-pay | Admitting: Medical

## 2023-11-24 NOTE — Telephone Encounter (Signed)
 Copied from CRM [id number]. Topic: Medicare AWV >> Nov 24, 2023 11:50 AM Nathanel DEL wrote: Reason for CRM: LVM 11/24/2023 to schedule AWV. Please schedule Virtual or Telehealth visits ONLY.   Nathanel Paschal; Care Guide Ambulatory Clinical Support [place] [facility] Direct Dial: [phone]

## 2024-01-25 ENCOUNTER — Ambulatory Visit (INDEPENDENT_AMBULATORY_CARE_PROVIDER_SITE_OTHER)

## 2024-01-25 VITALS — Ht 70.0 in | Wt 188.0 lb

## 2024-01-25 DIAGNOSIS — Z0000 Encounter for general adult medical examination without abnormal findings: Secondary | ICD-10-CM

## 2024-01-25 NOTE — Patient Instructions (Signed)
 Roger Williams,  Thank you for taking the time for your Medicare Wellness Visit. I appreciate your continued commitment to your health goals. Please review the care plan we discussed, and feel free to reach out if I can assist you further.  Medicare recommends these wellness visits once per year to help you and your care team stay ahead of potential health issues. These visits are designed to focus on prevention, allowing your provider to concentrate on managing your acute and chronic conditions during your regular appointments.  Please note that Annual Wellness Visits do not include a physical exam. Some assessments may be limited, especially if the visit was conducted virtually. If needed, we may recommend a separate in-person follow-up with your provider.  Ongoing Care Seeing your primary care provider every 3 to 6 months helps us  monitor your health and provide consistent, personalized care.   Referrals If a referral was made during today's visit and you haven't received any updates within two weeks, please contact the referred provider directly to check on the status.  Recommended Screenings:  Health Maintenance  Topic Date Due   Flu Shot  12/17/2023   COVID-19 Vaccine (3 - 2025-26 season) 01/17/2024   Medicare Annual Wellness Visit  01/24/2025   DTaP/Tdap/Td vaccine (3 - Td or Tdap) 09/21/2033   Pneumococcal Vaccine for age over 72  Completed   Zoster (Shingles) Vaccine  Completed   HPV Vaccine  Aged Out   Meningitis B Vaccine  Aged Out       01/25/2024    8:50 AM  Advanced Directives  Does Patient Have a Medical Advance Directive? No  Would patient like information on creating a medical advance directive? Yes (MAU/Ambulatory/Procedural Areas - Information given)   Advance Care Planning is important because it: Ensures you receive medical care that aligns with your values, goals, and preferences. Provides guidance to your family and loved ones, reducing the emotional burden of  decision-making during critical moments.  Vision: Annual vision screenings are recommended for early detection of glaucoma, cataracts, and diabetic retinopathy. These exams can also reveal signs of chronic conditions such as diabetes and high blood pressure.  Dental: Annual dental screenings help detect early signs of oral cancer, gum disease, and other conditions linked to overall health, including heart disease and diabetes.  Please see the attached documents for additional preventive care recommendations.

## 2024-01-25 NOTE — Progress Notes (Signed)
 Subjective:   DEMICO Williams is a 83 y.o. who presents for a Medicare Wellness preventive visit.  As a reminder, Annual Wellness Visits don't include a physical exam, and some assessments may be limited, especially if this visit is performed virtually. We may recommend an in-person follow-up visit with your provider if needed.  Visit Complete: Virtual I connected with  Gervis L Mato on 01/25/24 by a audio enabled telemedicine application and verified that I am speaking with the correct person using two identifiers.  Patient Location: Home  Provider Location: Home Office  I discussed the limitations of evaluation and management by telemedicine. The patient expressed understanding and agreed to proceed.  Vital Signs: Because this visit was a virtual/telehealth visit, some criteria may be missing or patient reported. Any vitals not documented were not able to be obtained and vitals that have been documented are patient reported.  VideoDeclined- This patient declined Librarian, academic. Therefore the visit was completed with audio only.  Persons Participating in Visit: Patient.  AWV Questionnaire: No: Patient Medicare AWV questionnaire was not completed prior to this visit.  Cardiac Risk Factors include: advanced age (>52men, >1 women);male gender;hypertension     Objective:    Today's Vitals   01/25/24 0835  Weight: 188 lb (85.3 kg)  Height: 5' 10 (1.778 m)   Body mass index is 26.98 kg/m.     01/25/2024    8:50 AM 01/21/2023    8:30 AM 01/16/2022    8:19 AM 12/05/2020    1:37 PM 12/05/2019    1:07 PM 07/26/2019    7:59 AM 04/30/2019    2:16 PM  Advanced Directives  Does Patient Have a Medical Advance Directive? No Yes Yes Yes Yes Yes Yes  Type of Furniture conservator/restorer;Living will Healthcare Power of [place];Living will Healthcare Power of [place];Living will Living will  Living will  Does patient want to make changes to  medical advance directive?  No - Patient declined No - Patient declined  No - Patient declined No - Patient declined No - Patient declined  Copy of Healthcare Power of Attorney in Chart?  No - copy requested  No - copy requested     Would patient like information on creating a medical advance directive? Yes (MAU/Ambulatory/Procedural Areas - Information given)          Current Medications (verified) Outpatient Encounter Medications as of 01/25/2024  Medication Sig   aspirin 81 MG tablet Take 81 mg by mouth daily.   losartan  (COZAAR ) 25 MG tablet Take 1 tablet (25 mg total) by mouth daily.   Multiple Vitamin (MULTIVITAMIN) tablet Take 1 tablet by mouth daily.   mupirocin  ointment (BACTROBAN ) 2 % Apply 1 Application topically 2 (two) times daily.   No facility-administered encounter medications on file as of 01/25/2024.    Allergies (verified) Patient has no known allergies.   History: Past Medical History:  Diagnosis Date   BPH (benign prostatic hyperplasia)    Hypertension    Past Surgical History:  Procedure Laterality Date   EYE SURGERY Right 11/23/2019   cataract   HERNIA REPAIR     JOINT REPLACEMENT     PROSTATE SURGERY     TONSILLECTOMY     Family History  Problem Relation Age of Onset   Parkinson's disease Father    Parkinson's disease Brother    Social History   Socioeconomic History   Marital status: Married    Spouse name: Not on  file   Number of children: 2   Years of education: Not on file   Highest education level: Not on file  Occupational History   Not on file  Tobacco Use   Smoking status: Never   Smokeless tobacco: Never  Vaping Use   Vaping status: Never Used  Substance and Sexual Activity   Alcohol use: Yes    Comment: Occasional   Drug use: No   Sexual activity: Yes  Other Topics Concern   Not on file  Social History Narrative   Not on file   Social Drivers of Health   Financial Resource Strain: Low Risk  (01/25/2024)   Overall Financial  Resource Strain (CARDIA)    Difficulty of Paying Living Expenses: Not hard at all  Food Insecurity: No Food Insecurity (01/25/2024)   Hunger Vital Sign    Worried About Running Out of Food in the Last Year: Never true    Ran Out of Food in the Last Year: Never true  Transportation Needs: No Transportation Needs (01/25/2024)   PRAPARE - Administrator, Civil Service (Medical): No    Lack of Transportation (Non-Medical): No  Physical Activity: Sufficiently Active (01/25/2024)   Exercise Vital Sign    Days of Exercise per Week: 5 days    Minutes of Exercise per Session: 30 min  Stress: No Stress Concern Present (01/25/2024)   [employer] of Occupational Health - Occupational Stress Questionnaire    Feeling of Stress: Only a little  Social Connections: Socially Integrated (01/25/2024)   Social Connection and Isolation Panel    Frequency of Communication with Friends and Family: More than three times a week    Frequency of Social Gatherings with Friends and Family: Three times a week    Attends Religious Services: More than 4 times per year    Active Member of Clubs or Organizations: Yes    Attends Banker Meetings: More than 4 times per year    Marital Status: Married    Tobacco Counseling Counseling given: Not Answered    Clinical Intake:  Pre-visit preparation completed: Yes  Pain : No/denies pain     Diabetes: No  Lab Results  Component Value Date   HGBA1C 5.7 09/22/2023   HGBA1C 5.6 10/07/2021   HGBA1C 5.8 10/02/2020     How often do you need to have someone help you when you read instructions, pamphlets, or other written materials from your doctor or pharmacy?: 1 - Never  Interpreter Needed?: No  Information entered by :: Charmaine Bloodgood LPN   Activities of Daily Living     01/25/2024    8:39 AM  In your present state of health, do you have any difficulty performing the following activities:  Hearing? 0  Vision? 0  Difficulty  concentrating or making decisions? 0  Walking or climbing stairs? 0  Dressing or bathing? 0  Doing errands, shopping? 0  Preparing Food and eating ? N  Using the Toilet? N  In the past six months, have you accidently leaked urine? N  Do you have problems with loss of bowel control? N  Managing your Medications? N  Managing your Finances? N  Housekeeping or managing your Housekeeping? N    Patient Care Team: Saguier, Edward, PA-C as PCP - General (Internal Medicine)  I have updated your Care Teams any recent Medical Services you may have received from other providers in the past year.     Assessment:   This is a routine  wellness examination for Roger Williams.  Hearing/Vision screen Hearing Screening - Comments:: Denies hearing difficulties   Vision Screening - Comments:: Wears rx glasses - up to date with routine eye exams with [facility] [place]    Goals Addressed             This Visit's Progress    Maintain healthy active lifestyle   On track      Depression Screen     01/25/2024    8:49 AM 01/21/2023    8:30 AM 01/16/2022    8:20 AM 12/05/2020    1:39 PM 12/05/2019    1:10 PM 06/03/2018    8:12 AM 12/25/2015    8:13 AM  PHQ 2/9 Scores  PHQ - 2 Score 0 0 0 1 0 0 0  PHQ- 9 Score       0    Fall Risk     01/25/2024    8:51 AM 01/21/2023    8:24 AM 01/16/2022    8:19 AM 12/05/2020    1:38 PM 12/05/2019    1:10 PM  Fall Risk   Falls in the past year? 0 1 0 0 0  Number falls in past yr: 0 0 0 0 0  Injury with Fall? 0 0 0 0 0  Risk for fall due to : No Fall Risks No Fall Risks No Fall Risks    Follow up Falls prevention discussed;Education provided;Falls evaluation completed Falls evaluation completed Falls evaluation completed  Falls prevention discussed  Education provided;Falls prevention discussed      Data saved with a previous flowsheet row definition    MEDICARE RISK AT HOME:  Medicare Risk at Home Any stairs in or around the home?: No If so, are there any without  handrails?: No Home free of loose throw rugs in walkways, pet beds, electrical cords, etc?: Yes Adequate lighting in your home to reduce risk of falls?: Yes Life alert?: No Use of a cane, walker or w/c?: No Grab bars in the bathroom?: Yes Shower chair or bench in shower?: No Elevated toilet seat or a handicapped toilet?: Yes  TIMED UP AND GO:  Was the test performed?  No  Cognitive Function: 6CIT completed        01/25/2024    8:51 AM 01/21/2023    8:31 AM 01/16/2022    8:23 AM  6CIT Screen  What Year? 0 points 0 points 0 points  What month? 0 points 0 points 0 points  What time? 0 points 0 points 0 points  Count back from 20 0 points 0 points 2 points  Months in reverse 0 points 0 points 0 points  Repeat phrase 0 points 0 points 0 points  Total Score 0 points 0 points 2 points    Immunizations Immunization History  Administered Date(s) Administered   INFLUENZA, HIGH DOSE SEASONAL PF 04/10/2015, 01/30/2019   Influenza Split 02/15/2018   Influenza-Unspecified 02/01/2013, 03/21/2014, 02/10/2016   PFIZER(Purple Top)SARS-COV-2 Vaccination 07/16/2019, 08/15/2019   Pneumococcal Conjugate-13 11/12/2014   Pneumococcal Polysaccharide-23 12/25/2015   Tdap 10/26/2012, 09/22/2023   Zoster Recombinant(Shingrix) 09/04/2016, 02/09/2017   Zoster, Live 03/21/2013    Screening Tests Health Maintenance  Topic Date Due   Influenza Vaccine  12/17/2023   COVID-19 Vaccine (3 - 2025-26 season) 01/17/2024   Medicare Annual Wellness (AWV)  01/24/2025   DTaP/Tdap/Td (3 - Td or Tdap) 09/21/2033   Pneumococcal Vaccine: 50+ Years  Completed   Zoster Vaccines- Shingrix  Completed   HPV VACCINES  Aged Out  Meningococcal B Vaccine  Aged Out    Health Maintenance Items Addressed: Information provided on vaccine recommendations   Additional Screening:  Vision Screening: Recommended annual ophthalmology exams for early detection of glaucoma and other disorders of the eye. Is the patient up to  date with their annual eye exam?  Yes  Who is the provider or what is the name of the office in which the patient attends annual eye exams? [facility]  [place]   Dental Screening: Recommended annual dental exams for proper oral hygiene  Community Resource Referral / Chronic Care Management: CRR required this visit?  No   CCM required this visit?  No   Plan:    I have personally reviewed and noted the following in the patient's chart:   Medical and social history Use of alcohol, tobacco or illicit drugs  Current medications and supplements including opioid prescriptions. Patient is not currently taking opioid prescriptions. Functional ability and status Nutritional status Physical activity Advanced directives List of other physicians Hospitalizations, surgeries, and ER visits in previous 12 months Vitals Screenings to include cognitive, depression, and falls Referrals and appointments  In addition, I have reviewed and discussed with patient certain preventive protocols, quality metrics, and best practice recommendations. A written personalized care plan for preventive services as well as general preventive health recommendations were provided to patient.   Lavelle Pfeiffer [place], [place]   [id number]   After Visit Summary: (MyChart) Due to this being a telephonic visit, the after visit summary with patients personalized plan was offered to patient via MyChart   Notes: Nothing significant to report at this time.

## 2024-03-14 ENCOUNTER — Encounter: Payer: Self-pay | Admitting: Family Medicine

## 2024-03-14 ENCOUNTER — Ambulatory Visit (INDEPENDENT_AMBULATORY_CARE_PROVIDER_SITE_OTHER): Admitting: Family Medicine

## 2024-03-14 VITALS — BP 134/66 | HR 68 | Temp 98.0°F | Resp 16 | Ht 70.0 in | Wt 188.8 lb

## 2024-03-14 DIAGNOSIS — J029 Acute pharyngitis, unspecified: Secondary | ICD-10-CM

## 2024-03-14 NOTE — Patient Instructions (Addendum)
 OK to take Tylenol  1000 mg (2 extra strength tabs) or 975 mg (3 regular strength tabs) every 6 hours as needed.  Consider salt water gargles.   Let me know if we aren't improving in the next few days.   Let us  know if you need anything.

## 2024-03-14 NOTE — Progress Notes (Signed)
 SUBJECTIVE:   Roger Williams is a 83 y.o. male presents to the clinic for:  Chief Complaint  Patient presents with   Sore Throat    Sore Throat     Complains of sore throat for 2 days.  It started when he was eating some chicken wings and choked a little bit.  It is slightly better but is still lingering. Other associated symptoms: sore throat.  Denies: sinus congestion, sinus pain, itchy watery eyes, ear pain, ear drainage, wheezing, shortness of breath, myalgia, and fevers Sick Contacts: none known Therapy to date: none  Social History   Tobacco Use  Smoking Status Never  Smokeless Tobacco Never    Patient's medications, allergies, past medical, surgical, social and family histories were reviewed and updated as appropriate.  OBJECTIVE:  BP 134/66 (BP Location: Left Arm, Cuff Size: Normal)   Pulse 68   Temp 98 F (36.7 C) (Oral)   Resp 16   Ht 5' 10 (1.778 m)   Wt 188 lb 12.8 oz (85.6 kg)   SpO2 98%   BMI 27.09 kg/m  General: Awake, alert, appearing stated age Eyes: conjunctivae and sclerae clear Ears: normal TMs bilaterally Nose: no visible exudate, no sinus ttp Oropharynx: normal findings: buccal mucosa normal and soft palate, uvula, and no exudate Neck: supple, no significant adenopathy Lungs: clear to auscultation, no wheezes, rales or rhonchi, symmetric air entry, normal effort Heart: RRR Skin:reveals no rash Psych: Age appropriate judgment and insight  ASSESSMENT/PLAN:  Sore throat  This should steadily get better.  Rapid strep test is negative.  Continue to practice good hand hygiene and push fluids. Acetaminophen  for pain. F/u prn. Pt voiced understanding and agreement to the plan.  Mabel [place], DO 03/14/24 10:44 AM

## 2024-06-20 ENCOUNTER — Other Ambulatory Visit: Payer: Self-pay | Admitting: Family Medicine

## 2024-06-20 DIAGNOSIS — I10 Essential (primary) hypertension: Secondary | ICD-10-CM

## 2024-06-20 NOTE — Telephone Encounter (Signed)
 Pt called but received no answer detailed vm left for pt to call and schedule

## 2024-06-26 ENCOUNTER — Ambulatory Visit: Admitting: Medical

## 2025-02-06 ENCOUNTER — Ambulatory Visit
